# Patient Record
Sex: Female | Born: 1945 | Race: White | Hispanic: No | Smoking: Former smoker
Health system: Southern US, Community
[De-identification: ages and names within clinical notes are randomized; demographics above are authoritative.]

## PROBLEM LIST (undated history)

## (undated) DIAGNOSIS — J449 Chronic obstructive pulmonary disease, unspecified: Secondary | ICD-10-CM

## (undated) DIAGNOSIS — K219 Gastro-esophageal reflux disease without esophagitis: Secondary | ICD-10-CM

## (undated) DIAGNOSIS — I10 Essential (primary) hypertension: Secondary | ICD-10-CM

## (undated) DIAGNOSIS — F419 Anxiety disorder, unspecified: Secondary | ICD-10-CM

## (undated) DIAGNOSIS — E079 Disorder of thyroid, unspecified: Secondary | ICD-10-CM

## (undated) HISTORY — PX: WISDOM TOOTH EXTRACTION: SHX21

## (undated) HISTORY — DX: Anxiety disorder, unspecified: F41.9

## (undated) HISTORY — PX: OTHER SURGICAL HISTORY: SHX169

## (undated) HISTORY — PX: CATARACT EXTRACTION: SUR2

## (undated) HISTORY — PX: LAPAROSCOPY: SHX197

---

## 2004-12-20 ENCOUNTER — Emergency Department (HOSPITAL_COMMUNITY): Admission: EM | Admit: 2004-12-20 | Discharge: 2004-12-20 | Payer: Self-pay | Admitting: Emergency Medicine

## 2006-03-02 ENCOUNTER — Ambulatory Visit (HOSPITAL_COMMUNITY): Admission: RE | Admit: 2006-03-02 | Discharge: 2006-03-02 | Payer: Self-pay | Admitting: Internal Medicine

## 2010-10-20 ENCOUNTER — Emergency Department (HOSPITAL_COMMUNITY)
Admission: EM | Admit: 2010-10-20 | Discharge: 2010-10-20 | Payer: Self-pay | Source: Home / Self Care | Admitting: Emergency Medicine

## 2011-02-09 LAB — DIFFERENTIAL
Basophils Absolute: 0.1 10*3/uL (ref 0.0–0.1)
Basophils Relative: 1 % (ref 0–1)
Eosinophils Relative: 1 % (ref 0–5)
Monocytes Absolute: 0.6 10*3/uL (ref 0.1–1.0)

## 2011-02-09 LAB — COMPREHENSIVE METABOLIC PANEL
ALT: 12 U/L (ref 0–35)
AST: 16 U/L (ref 0–37)
Albumin: 4.2 g/dL (ref 3.5–5.2)
Alkaline Phosphatase: 58 U/L (ref 39–117)
Chloride: 98 mEq/L (ref 96–112)
GFR calc Af Amer: >60 mL/min (ref >60)
Potassium: 4.3 mEq/L (ref 3.5–5.1)
Sodium: 133 mEq/L — ABNORMAL LOW (ref 135–145)
Total Bilirubin: 0.5 mg/dL (ref 0.3–1.2)

## 2011-02-09 LAB — CBC
MCV: 87.9 fL (ref 78.0–100.0)
Platelets: 289 10*3/uL (ref 150–400)
RBC: 4.63 MIL/uL (ref 3.87–5.11)
WBC: 9.6 10*3/uL (ref 4.0–10.5)

## 2012-10-04 ENCOUNTER — Other Ambulatory Visit: Payer: Self-pay | Admitting: Internal Medicine

## 2012-10-04 DIAGNOSIS — Z1231 Encounter for screening mammogram for malignant neoplasm of breast: Secondary | ICD-10-CM

## 2012-11-17 ENCOUNTER — Ambulatory Visit: Payer: Self-pay

## 2013-06-20 ENCOUNTER — Other Ambulatory Visit (HOSPITAL_COMMUNITY): Payer: Self-pay | Admitting: Internal Medicine

## 2013-06-20 DIAGNOSIS — Z139 Encounter for screening, unspecified: Secondary | ICD-10-CM

## 2013-06-26 ENCOUNTER — Ambulatory Visit (HOSPITAL_COMMUNITY): Payer: Self-pay

## 2013-12-28 ENCOUNTER — Other Ambulatory Visit: Payer: Self-pay | Admitting: Internal Medicine

## 2013-12-28 ENCOUNTER — Ambulatory Visit
Admission: RE | Admit: 2013-12-28 | Discharge: 2013-12-28 | Disposition: A | Discharge status: Home / Self Care | Payer: Medicare Other | Source: Ambulatory Visit | Attending: Internal Medicine | Admitting: Internal Medicine

## 2013-12-28 DIAGNOSIS — K219 Gastro-esophageal reflux disease without esophagitis: Secondary | ICD-10-CM

## 2018-06-21 ENCOUNTER — Encounter (HOSPITAL_COMMUNITY): Payer: Self-pay | Admitting: Emergency Medicine

## 2018-06-21 ENCOUNTER — Emergency Department (HOSPITAL_COMMUNITY)
Admission: EM | Admit: 2018-06-21 | Discharge: 2018-06-21 | Disposition: A | Discharge status: Home / Self Care | Payer: Medicare Other | Attending: Emergency Medicine | Admitting: Emergency Medicine

## 2018-06-21 ENCOUNTER — Other Ambulatory Visit: Payer: Self-pay

## 2018-06-21 DIAGNOSIS — F17210 Nicotine dependence, cigarettes, uncomplicated: Secondary | ICD-10-CM | POA: Insufficient documentation

## 2018-06-21 DIAGNOSIS — E871 Hypo-osmolality and hyponatremia: Secondary | ICD-10-CM | POA: Diagnosis not present

## 2018-06-21 DIAGNOSIS — Z720 Tobacco use: Secondary | ICD-10-CM

## 2018-06-21 DIAGNOSIS — R030 Elevated blood-pressure reading, without diagnosis of hypertension: Secondary | ICD-10-CM | POA: Diagnosis present

## 2018-06-21 DIAGNOSIS — Z79899 Other long term (current) drug therapy: Secondary | ICD-10-CM | POA: Diagnosis not present

## 2018-06-21 DIAGNOSIS — I10 Essential (primary) hypertension: Secondary | ICD-10-CM | POA: Diagnosis not present

## 2018-06-21 HISTORY — DX: Disorder of thyroid, unspecified: E07.9

## 2018-06-21 HISTORY — DX: Gastro-esophageal reflux disease without esophagitis: K21.9

## 2018-06-21 HISTORY — DX: Essential (primary) hypertension: I10

## 2018-06-21 LAB — CBC
HCT: 42 % (ref 36.0–46.0)
Hemoglobin: 14.3 g/dL (ref 12.0–15.0)
MCH: 29.5 pg (ref 26.0–34.0)
MCHC: 34 g/dL (ref 30.0–36.0)
MCV: 86.8 fL (ref 78.0–100.0)
PLATELETS: 322 10*3/uL (ref 150–400)
RBC: 4.84 MIL/uL (ref 3.87–5.11)
RDW: 14.8 % (ref 11.5–15.5)
WBC: 11 10*3/uL — AB (ref 4.0–10.5)

## 2018-06-21 LAB — BASIC METABOLIC PANEL
Anion gap: 10 (ref 5–15)
BUN: 8 mg/dL (ref 8–23)
CALCIUM: 9.4 mg/dL (ref 8.9–10.3)
CO2: 27 mmol/L (ref 22–32)
CREATININE: 0.6 mg/dL (ref 0.44–1.00)
Chloride: 95 mmol/L — ABNORMAL LOW (ref 98–111)
Glucose, Bld: 103 mg/dL — ABNORMAL HIGH (ref 70–99)
Potassium: 3.9 mmol/L (ref 3.5–5.1)
SODIUM: 132 mmol/L — AB (ref 135–145)

## 2018-06-21 MED ORDER — HYDROCHLOROTHIAZIDE 12.5 MG PO CAPS: 12.5000 mg | ORAL_CAPSULE | Freq: Every day | ORAL | Status: DC

## 2018-06-21 MED ORDER — HYDROCHLOROTHIAZIDE 25 MG PO TABS
ORAL_TABLET | ORAL | Status: AC
  Administered 2018-06-21: 12.5 mg
  Filled 2018-06-21: qty 1

## 2018-06-21 MED ORDER — HYDROCHLOROTHIAZIDE 12.5 MG PO CAPS: 12.5000 mg | ORAL_CAPSULE | Freq: Every day | ORAL | 0 refills | Status: DC

## 2018-06-21 NOTE — ED Provider Notes (Signed)
[facility] EMERGENCY DEPARTMENT Provider Note   CSN: [id number] Arrival date & time: 06/21/18  1519     History   Chief Complaint Chief Complaint  Patient presents with  . Hypertension    HPI Sydney Jones is a 72 y.o. female.  HPI   She is here today for evaluation of high blood pressure which was found today at preop for cataract removal from left eye.  She had a right eye cataract removal last week and at that time her blood pressure was mildly elevated.  Today it was 210/110.  She denies headache, chest pain, shortness of breath, weakness or dizziness.  She is taking her usual medication, lisinopril, as prescribed.  She smokes cigarettes.  There are no other no modifying factors.  Past Medical History:  Diagnosis Date  . Acid reflux   . Hypertension   . Thyroid disease     There are no active problems to display for this patient.   Past Surgical History:  Procedure Laterality Date  . CATARACT EXTRACTION    . perdontal    . WISDOM TOOTH EXTRACTION       OB History   None      Home Medications    Prior to Admission medications   Medication Sig Start Date End Date Taking? Authorizing Provider  DUREZOL 0.05 % EMUL INSTILL 1 DROP TWICE DAILY IN THE OPERATIVE EYE ( START AFTER SURGERY FOR 3 WEEKS) 05/25/18  Yes [provider]  esomeprazole (NEXIUM) 40 MG capsule Take 40 mg by mouth every morning. 06/04/18  Yes [provider]  levothyroxine (SYNTHROID, LEVOTHROID) 25 MCG tablet Take 25 mcg by mouth every morning. 05/14/18  Yes [provider]  lisinopril (PRINIVIL,ZESTRIL) 40 MG tablet Take 40 mg by mouth every morning. 06/04/18  Yes [provider]  loratadine (CLARITIN) 10 MG tablet Take 10 mg by mouth every morning.   Yes [provider]  ofloxacin (OCUFLOX) 0.3 % ophthalmic solution INSTILL 1 DROP TWICE DAILY IN THE OPERATIVE (LEFT) EYE ( START 2 DAYS PRIOR TO SURGERY AND CONTINUE 3 WEEKS AFTER SURGERY) 05/25/18  Yes  [provider]  hydrochlorothiazide (MICROZIDE) 12.5 MG capsule Take 1 capsule (12.5 mg total) by mouth daily. 06/21/18   Mancel Bale, MD    Family History History reviewed. No pertinent family history.  Social History Social History   Tobacco Use  . Smoking status: Current Every Day Smoker    Packs/day: 1.00    Types: Cigarettes  . Smokeless tobacco: Never Used  Substance Use Topics  . Alcohol use: Not Currently  . Drug use: Never     Allergies   Codeine and Tetracyclines & related   Review of Systems Review of Systems  All other systems reviewed and are negative.    Physical Exam Updated Vital Signs BP (!) 229/99   Pulse 77   Temp 98.3 F (36.8 C) (Oral)   Resp 18   Ht 5' 3.5" (1.613 m)   Wt 54.4 kg (120 lb)   SpO2 95%   BMI 20.92 kg/m   Physical Exam  Constitutional: She is oriented to person, place, and time. She appears well-developed and well-nourished. No distress.  HENT:  Head: Normocephalic and atraumatic.  Eyes: Pupils are equal, round, and reactive to light. Conjunctivae and EOM are normal.  Neck: Normal range of motion and phonation normal. Neck supple.  Cardiovascular: Normal rate and regular rhythm.  Pulmonary/Chest: Effort normal and breath sounds normal. She exhibits no tenderness.  Abdominal: Soft. She exhibits no distension. There is no tenderness. There is no guarding.  Musculoskeletal: Normal range of motion. She exhibits no edema, tenderness or deformity.  Neurological: She is alert and oriented to person, place, and time. She exhibits normal muscle tone.  Skin: Skin is warm and dry.  Psychiatric: She has a normal mood and affect. Her behavior is normal. Judgment and thought content normal.  Nursing note and vitals reviewed.    ED Treatments / Results  Labs (all labs ordered are listed, but only abnormal results are displayed) Labs Reviewed  BASIC METABOLIC PANEL - Abnormal; Notable for the following components:       Result Value   Sodium 132 (*)    Chloride 95 (*)    Glucose, Bld 103 (*)    All other components within normal limits  CBC - Abnormal; Notable for the following components:   WBC 11.0 (*)    All other components within normal limits    EKG None  Radiology No results found.  Procedures Procedures (including critical care time)  Medications Ordered in ED Medications  hydrochlorothiazide (MICROZIDE) capsule 12.5 mg (12.5 mg Oral Not Given 06/21/18 1725)  hydrochlorothiazide (HYDRODIURIL) 25 MG tablet (12.5 mg  Given 06/21/18 1725)     Initial Impression / Assessment and Plan / ED Course  I have reviewed the triage vital signs and the nursing notes.  Pertinent labs & imaging results that were available during my care of the patient were reviewed by me and considered in my medical decision making (see chart for details).  Clinical Course as of Jun 21 1750  Wed Jun 21, 2018  1716 Normal except white count high 10  CBC(!) [EW]  1740 Normal except sodium low, chloride low, glucose high  Basic metabolic panel(!) [EW]  1740 Normal except white count high  CBC(!) [EW]  1740 Persistent elevation  BP(!): 229/99 [EW]    Clinical Course User Index [EW] Mancel BaleWentz, Daijanae Rafalski, MD     Patient Vitals for the past 24 hrs:  BP Temp Temp src Pulse Resp SpO2 Height Weight  06/21/18 1730 (!) 229/99 - - - - - - -  06/21/18 1700 (!) 214/98 - - 77 - 95 % - -  06/21/18 1651 - - - 79 - 98 % - -  06/21/18 1650 - - - 84 - 95 % - -  06/21/18 1649 (!) 232/102 - - 78 - 96 % - -  06/21/18 1538 (!) 229/100 98.3 F (36.8 C) Oral 80 18 96 % - -  06/21/18 1535 - - - - - - 5' 3.5" (1.613 m) 54.4 kg (120 lb)    5:49 PM Reevaluation with update and discussion. After initial assessment and treatment, an updated evaluation reveals she remains comfortable, and does not have any further complaints.  He states that she has been trying to avoid drinking a lot of water because of low sodium in the past.  Findings  discussed and questions answered. Mancel BaleElliott Alyzah Pelly   Medical Decision Making: Hypertension, asymptomatic, with incidental mild hyponatremia and hypochloremia.  Patient smokes cigarettes.  Patient may benefit from increasing electrolyte intake, and close monitoring while on a diuretic medication.  She has a PCP for follow-up care.  Hypertensive urgency, metabolic instability or impending vascular collapse.  CRITICAL CARE-no Performed by: Mancel BaleElliott Deylan Canterbury   Nursing Notes Reviewed/ Care Coordinated Applicable Imaging Reviewed Interpretation of Laboratory Data incorporated into ED treatment  The patient appears reasonably screened and/or stabilized for discharge and I  doubt any other medical condition or other [facility] requiring further screening, evaluation, or treatment in the ED at this time prior to discharge.  Plan: Home Medications-continue usual medications; Home Treatments-increase electrolyte intake using a product such as Gatorade once or twice a day.  Stop smoking.; return here if the recommended treatment, does not improve the symptoms; Recommended follow up-PCP follow-up 1 week for blood pressure check and repeat electrolyte testing.     Final Clinical Impressions(s) / ED Diagnoses   Final diagnoses:  Hypertension, unspecified type  Hyponatremia  Tobacco abuse    ED Discharge Orders        Ordered    hydrochlorothiazide (MICROZIDE) 12.5 MG capsule  Daily     06/21/18 1746       Mancel Bale, MD 06/21/18 1751

## 2018-06-21 NOTE — ED Triage Notes (Signed)
Pt states went to surgical center today to have cataract surgery and had to cancel due to her hypertension. Pt denies headache or other symptoms. Pt states took lisinopril this am and has been checking BP at home and has not came down. Unable to get appt with MD.

## 2018-06-21 NOTE — Discharge Instructions (Addendum)
Your blood pressure is mildly elevated today and requires an additional medication.  We are choosing hydrochlorothiazide, to help that.  Unfortunately this can make sodium drop and yours is already slightly low.  To help counteract this effect, drink 16 to 32 ounces of Gatorade each day, as an electrolyte supplement.  Follow-up with your PCP next week for checkup and clearance for surgery.  Try to stop smoking and get some exercise daily to improve your condition.

## 2018-08-22 ENCOUNTER — Other Ambulatory Visit: Payer: Self-pay | Admitting: Internal Medicine

## 2018-08-22 ENCOUNTER — Ambulatory Visit
Admission: RE | Admit: 2018-08-22 | Discharge: 2018-08-22 | Disposition: A | Discharge status: Home / Self Care | Payer: Medicare Other | Source: Ambulatory Visit | Attending: Internal Medicine | Admitting: Internal Medicine

## 2018-08-22 DIAGNOSIS — F17210 Nicotine dependence, cigarettes, uncomplicated: Secondary | ICD-10-CM

## 2018-09-28 ENCOUNTER — Encounter (HOSPITAL_COMMUNITY): Payer: Self-pay | Admitting: Emergency Medicine

## 2018-09-28 ENCOUNTER — Emergency Department (HOSPITAL_COMMUNITY)
Admission: EM | Admit: 2018-09-28 | Discharge: 2018-09-28 | Disposition: A | Discharge status: Home / Self Care | Payer: Medicare Other | Attending: Emergency Medicine | Admitting: Emergency Medicine

## 2018-09-28 ENCOUNTER — Other Ambulatory Visit: Payer: Self-pay

## 2018-09-28 ENCOUNTER — Emergency Department (HOSPITAL_COMMUNITY): Payer: Medicare Other

## 2018-09-28 DIAGNOSIS — Z79899 Other long term (current) drug therapy: Secondary | ICD-10-CM | POA: Insufficient documentation

## 2018-09-28 DIAGNOSIS — F17210 Nicotine dependence, cigarettes, uncomplicated: Secondary | ICD-10-CM | POA: Insufficient documentation

## 2018-09-28 DIAGNOSIS — I10 Essential (primary) hypertension: Secondary | ICD-10-CM | POA: Insufficient documentation

## 2018-09-28 DIAGNOSIS — M25562 Pain in left knee: Secondary | ICD-10-CM | POA: Diagnosis present

## 2018-09-28 NOTE — Discharge Instructions (Addendum)
Elevate and apply ice packs on and off to your knee.  Wear the knee sleeve as needed for support but do not wear continuously or at bedtime.  Tylenol every 4 hours if needed for pain.  Call the orthopedic provider listed to arrange follow-up appointment in 1 week if not improving.

## 2018-09-28 NOTE — ED Provider Notes (Signed)
[facility] EMERGENCY DEPARTMENT Provider Note   CSN: [id number] Arrival date & time: 09/28/18  1505     History   Chief Complaint Chief Complaint  Patient presents with  . Knee Pain    HPI Sydney Jones is a 72 y.o. female.  HPI   Sydney Jones is a 72 y.o. female who presents to the Emergency Department complaining of left lateral knee pain that began earlier today.  She states that she was "kicking off my shoe" and felt a pop in the side of her left knee.  No fall.  She states the pain was severe at the time and when she attempted to bear weight, she felt another popping sensation to her lateral knee.  Since that time pain has improved.  She states that she is able to ambulate with mild discomfort only.  She has not tried any therapies prior to arrival.  She denies fall, swelling, numbness, or other injuries.  No previous knee pain.  Past Medical History:  Diagnosis Date  . Acid reflux   . Hypertension   . Thyroid disease     There are no active problems to display for this patient.   Past Surgical History:  Procedure Laterality Date  . CATARACT EXTRACTION    . perdontal    . WISDOM TOOTH EXTRACTION       OB History   None      Home Medications    Prior to Admission medications   Medication Sig Start Date End Date Taking? Authorizing Provider  DUREZOL 0.05 % EMUL INSTILL 1 DROP TWICE DAILY IN THE OPERATIVE EYE ( START AFTER SURGERY FOR 3 WEEKS) 05/25/18   [provider]  esomeprazole (NEXIUM) 40 MG capsule Take 40 mg by mouth every morning. 06/04/18   [provider]  hydrochlorothiazide (MICROZIDE) 12.5 MG capsule Take 1 capsule (12.5 mg total) by mouth daily. 06/21/18   Mancel Bale, MD  levothyroxine (SYNTHROID, LEVOTHROID) 25 MCG tablet Take 25 mcg by mouth every morning. 05/14/18   [provider]  lisinopril (PRINIVIL,ZESTRIL) 40 MG tablet Take 40 mg by mouth every morning. 06/04/18   [provider]  loratadine  (CLARITIN) 10 MG tablet Take 10 mg by mouth every morning.    [provider]  ofloxacin (OCUFLOX) 0.3 % ophthalmic solution INSTILL 1 DROP TWICE DAILY IN THE OPERATIVE (LEFT) EYE ( START 2 DAYS PRIOR TO SURGERY AND CONTINUE 3 WEEKS AFTER SURGERY) 05/25/18   [provider]    Family History History reviewed. No pertinent family history.  Social History Social History   Tobacco Use  . Smoking status: Current Every Day Smoker    Packs/day: 1.00    Types: Cigarettes  . Smokeless tobacco: Never Used  Substance Use Topics  . Alcohol use: Not Currently  . Drug use: Never     Allergies   Codeine and Tetracyclines & related   Review of Systems Review of Systems  Constitutional: Negative for chills and fever.  Gastrointestinal: Negative for nausea and vomiting.  Musculoskeletal: Positive for arthralgias (Left knee pain). Negative for back pain, joint swelling and neck pain.  Skin: Negative for color change and wound.  Neurological: Negative for weakness and numbness.     Physical Exam Updated Vital Signs BP (!) 156/76 (BP Location: Right Arm)   Pulse 79   Temp 97.9 F (36.6 C) (Oral)   Resp 16   Ht 5\' 4"  (1.626 m)   Wt 54.4 kg   SpO2 96%  BMI 20.60 kg/m   Physical Exam  Constitutional: She appears well-nourished. No distress.  HENT:  Head: Atraumatic.  Mouth/Throat: Oropharynx is clear and moist.  Cardiovascular: Normal rate, regular rhythm and intact distal pulses.  Pulmonary/Chest: Effort normal and breath sounds normal.  Musculoskeletal: Normal range of motion. She exhibits tenderness. She exhibits no edema or deformity.  Mild ttp of the anterior left knee with movement of the patella.  No bony deformity.  Neg Drawer sign.  No tenderness on valgus and varus stress.  No erythema or effusion  Neurological: She is alert. No sensory deficit.  Skin: Skin is warm. Capillary refill takes less than 2 seconds.  Nursing note and vitals reviewed.    ED  Treatments / Results  Labs (all labs ordered are listed, but only abnormal results are displayed) Labs Reviewed - No data to display  EKG None  Radiology Dg Knee Complete 4 Views Left  Result Date: 09/28/2018 CLINICAL DATA:  Popping sensation EXAM: LEFT KNEE - COMPLETE 4+ VIEW COMPARISON:  None. FINDINGS: No evidence of fracture, dislocation, or joint effusion. No evidence of arthropathy or other focal bone abnormality. Soft tissues are unremarkable. IMPRESSION: Negative. Electronically Signed   By: Gerome Sam III M.D   On: 09/28/2018 15:41    Procedures Procedures (including critical care time)  Medications Ordered in ED Medications - No data to display   Initial Impression / Assessment and Plan / ED Course  I have reviewed the triage vital signs and the nursing notes.  Pertinent labs & imaging results that were available during my care of the patient were reviewed by me and considered in my medical decision making (see chart for details).     Patient well-appearing.  X-ray reassuring.  No fall.  Possible self resolved patellar dislocation.  Remains neurovascularly intact.  She has full range of motion of the left knee at this time.  She is able to bear weight without difficulty. Knee Sleeve applied.  Patient agrees to RICE therapy and close orthopedic follow-up in 1 week if not improving.  Final Clinical Impressions(s) / ED Diagnoses   Final diagnoses:  Acute pain of left knee    ED Discharge Orders    None       Pauline Aus, PA-C 09/28/18 Merrily Brittle    Eber Hong, MD 09/29/18 1226

## 2018-09-28 NOTE — ED Triage Notes (Signed)
Pt states she kicked her shoe off and felt a pop in her knee on the L side. Was able to walk on it but still felt "popping" sensation. Denies previous injury.

## 2019-10-23 ENCOUNTER — Other Ambulatory Visit: Payer: Self-pay

## 2019-10-23 DIAGNOSIS — Z20822 Contact with and (suspected) exposure to covid-19: Secondary | ICD-10-CM

## 2019-10-24 LAB — NOVEL CORONAVIRUS, NAA: SARS-CoV-2, NAA: NOT DETECTED

## 2020-02-25 ENCOUNTER — Ambulatory Visit: Payer: Medicare Other | Attending: Internal Medicine

## 2020-02-25 ENCOUNTER — Other Ambulatory Visit: Payer: Self-pay

## 2020-02-25 DIAGNOSIS — Z20822 Contact with and (suspected) exposure to covid-19: Secondary | ICD-10-CM

## 2020-02-26 LAB — NOVEL CORONAVIRUS, NAA: SARS-CoV-2, NAA: NOT DETECTED

## 2020-02-26 LAB — SARS-COV-2, NAA 2 DAY TAT

## 2020-02-27 ENCOUNTER — Telehealth: Payer: Self-pay | Admitting: *Deleted

## 2020-02-27 NOTE — Telephone Encounter (Signed)
Reviewed negative Covid 19 results with patient.

## 2020-03-19 ENCOUNTER — Encounter: Payer: Self-pay | Admitting: Gastroenterology

## 2020-04-03 NOTE — Progress Notes (Signed)
Referring Provider: Wenda Low, MD Primary Care Physician:  Wenda Low, MD Primary Gastroenterologist:  Dr. Gala Romney  Chief Complaint  Patient presents with  . Abdominal Pain    mid upper abd, milk helps pain  . Constipation    "since I was born"  . +stool test    never had tcs; trying to avoid tcs    HPI:   Sydney Jones is a 74 y.o. female presenting today at the request of Wenda Low, MD for positive stool test.  Most recent hemoglobin 14.3 from July 2019. Hemoglobin 13.5 in October 2020. Stool test result was not sent with referral.  Today: States she is not here for + stool test. She is concerned about her abdominal pain.   After eating breakfast, she has aching in the upper abdomen. Toast with ham or bacon/egg for breakfast. Improves with drinking milk. Around 2PM, ache returns. Doesn't bother her at night. Started 4 weeks ago. No unintentional weight loss. No change in appetite. No early satiety. Last Tuesday, bitter taste started in back of her throat. Occurring most days. Takes nexium 40 mg daily for about 12 years for reflux.  No NSAIDs. No black stools. No blood in the stool. No alcohol. No prior EGD. No right sided pain. No N/V. No dysphagia.   Has struggled with constipation since she was born.  No worsens this.  Taking dulcolax as needed. Last BM Tuesday. Has taken dulcolax the last 3 days, still no BM. Always has to strain to get BMs started. This is her baseline. No abdominal pain.  Stool test in October. Saw GI doc in [place]. [facility]. Requested another stool test. Had a second stool test completed in Jan.  Repeat stool test in April.  She was told it was positive. This is when her stomach problem started. Doesn't want to be seen in [place].   Doesn't want to have a colonoscopy.  States she will consider this further.  She wants her upper abdominal pain addressed.  Reports having hemorrhoids since giving birth. Has stinging and burning at times.  Not bothered by this. Has used preparation H in the past when hemorrhoids were causing more trouble but nothing significant in 30 years.   Past Medical History:  Diagnosis Date  . Acid reflux   . Anxiety   . Hypertension   . Thyroid disease    Hypothyroid    Past Surgical History:  Procedure Laterality Date  . CATARACT EXTRACTION    . LAPAROSCOPY  1980s   for endometriosis   . perdontal    . WISDOM TOOTH EXTRACTION      Current Outpatient Medications  Medication Sig Dispense Refill  . amLODipine (NORVASC) 5 MG tablet Take 5 mg by mouth 2 (two) times daily.    Marland Kitchen doxazosin (CARDURA) 2 MG tablet Take 2 mg by mouth at bedtime.    Marland Kitchen levothyroxine (SYNTHROID, LEVOTHROID) 25 MCG tablet Take 25 mcg by mouth every morning.  6  . lisinopril (PRINIVIL,ZESTRIL) 40 MG tablet Take 40 mg by mouth every morning.  2  . loratadine (CLARITIN) 10 MG tablet Take 10 mg by mouth every morning.    . pantoprazole (PROTONIX) 40 MG tablet Take 1 tablet (40 mg total) by mouth daily before breakfast. 30 tablet 3   No current facility-administered medications for this visit.    Allergies as of 04/04/2020 - Review Complete 04/04/2020  Allergen Reaction Noted  . Codeine Nausea Only 06/21/2018  . Tetracyclines & related Other (See  Comments) 06/21/2018    Family History  Problem Relation Age of Onset  . Colon cancer Neg Hx   . Stomach cancer Neg Hx   . Esophageal cancer Neg Hx   . Pancreatic cancer Neg Hx     Social History   Socioeconomic History  . Marital status: Married    Spouse name: Not on file  . Number of children: Not on file  . Years of education: Not on file  . Highest education level: Not on file  Occupational History  . Not on file  Tobacco Use  . Smoking status: Current Every Day Smoker    Packs/day: 1.00    Types: Cigarettes  . Smokeless tobacco: Never Used  Substance and Sexual Activity  . Alcohol use: Not Currently  . Drug use: Never  . Sexual activity: Not on file    Other Topics Concern  . Not on file  Social History Narrative  . Not on file   Social Determinants of Health   Financial Resource Strain:   . Difficulty of Paying Living Expenses:   Food Insecurity:   . Worried About Programme researcher, broadcasting/film/video in the Last Year:   . Barista in the Last Year:   Transportation Needs:   . Freight forwarder (Medical):   Marland Kitchen Lack of Transportation (Non-Medical):   Physical Activity:   . Days of Exercise per Week:   . Minutes of Exercise per Session:   Stress:   . Feeling of Stress :   Social Connections:   . Frequency of Communication with Friends and Family:   . Frequency of Social Gatherings with Friends and Family:   . Attends Religious Services:   . Active Member of Clubs or Organizations:   . Attends Banker Meetings:   Marland Kitchen Marital Status:   Intimate Partner Violence:   . Fear of Current or Ex-Partner:   . Emotionally Abused:   Marland Kitchen Physically Abused:   . Sexually Abused:     Review of Systems: Gen: Denies any fever, chills, lightheadedness, dizziness, presyncope, syncope. CV: Denies chest pain or heart palpitations. Resp: Occasional shortness of breath. None at rest. Occasional cough in the morning.  GI: See HPI GU : Denies urinary burning, urinary frequency, urinary hesitancy MS: Denies joint pain Derm: Denies rash Psych: Denies depression or anxiety Heme: See HPI  Physical Exam: BP 136/69   Pulse 90   Temp (!) 96.8 F (36 C) (Temporal)   Ht 5\' 4"  (1.626 m)   Wt 127 lb 6.4 oz (57.8 kg)   BMI 21.87 kg/m  General:   Alert and oriented. Pleasant and cooperative. Well-nourished and well-developed.  Head:  Normocephalic and atraumatic. Eyes:  Without icterus, sclera clear and conjunctiva pink.  Ears:  Normal auditory acuity. Lungs:  Clear to auscultation bilaterally. No wheezes, rales, or rhonchi. No distress.  Heart:  S1, S2 present without murmurs appreciated.  Abdomen:  +BS, soft, non-tender and non-distended.  No HSM noted. No guarding or rebound. No masses appreciated.  Rectal:  Deferred  Msk:  Symmetrical without gross deformities. Normal posture. Extremities:  Without edema. Neurologic:  Alert and  oriented x4;  grossly normal neurologically. Skin:  Intact without significant lesions or rashes. Psych: Normal mood and affect.

## 2020-04-04 ENCOUNTER — Ambulatory Visit: Payer: Medicare Other | Admitting: Gastroenterology

## 2020-04-04 ENCOUNTER — Other Ambulatory Visit: Payer: Self-pay

## 2020-04-04 ENCOUNTER — Encounter: Payer: Self-pay | Admitting: Gastroenterology

## 2020-04-04 DIAGNOSIS — R1013 Epigastric pain: Secondary | ICD-10-CM | POA: Diagnosis not present

## 2020-04-04 DIAGNOSIS — R195 Other fecal abnormalities: Secondary | ICD-10-CM | POA: Diagnosis not present

## 2020-04-04 DIAGNOSIS — K5900 Constipation, unspecified: Secondary | ICD-10-CM | POA: Diagnosis not present

## 2020-04-04 MED ORDER — PANTOPRAZOLE SODIUM 40 MG PO TBEC: 40.0000 mg | DELAYED_RELEASE_TABLET | Freq: Every day | ORAL | 3 refills | Status: DC

## 2020-04-04 NOTE — Patient Instructions (Addendum)
We will get you scheduled for an upper endoscopy in the near future with Dr. Gala Romney.  Please have labs completed at [facility].  We will call you with results.  Stop Nexium and start Protonix 40 mg daily 30 minutes before breakfast.  Be sure you separate Protonix at least 3 hours from your levothyroxine.  Be sure to follow GERD diet.  Avoid fried, fatty, greasy, spicy, citrus foods.  Avoid caffeine and carbonated beverages.  Do not eat within 3 hours of laying down.  Avoid all NSAIDs.  This includes ibuprofen, Aleve, Advil, and Goody powders.  For constipation: I am providing samples of Linzess 145 mcg.  Take this daily 30 minutes before your first meal.  Call in 1-2 weeks with a progress report. Sure you are drinking enough water to keep urine pale yellow to clear. Be sure you are eating plenty of fruits and vegetables to maintain adequate fiber intake.  We will plan to follow-up with you after your upper endoscopy.  Call with questions or concerns prior.  Aliene Altes, PA-C [facility]    Food Choices for Gastroesophageal Reflux Disease, Adult When you have gastroesophageal reflux disease (GERD), the foods you eat and your eating habits are very important. Choosing the right foods can help ease the discomfort of GERD. Consider working with a diet and nutrition specialist (dietitian) to help you make healthy food choices. What general guidelines should I follow?  Eating plan  Choose healthy foods low in fat, such as fruits, vegetables, whole grains, low-fat dairy products, and lean meat, fish, and poultry.  Eat frequent, small meals instead of three large meals each day. Eat your meals slowly, in a relaxed setting. Avoid bending over or lying down until 2-3 hours after eating.  Limit high-fat foods such as fatty meats or fried foods.  Limit your intake of oils, butter, and shortening to less than 8 teaspoons each day.  Avoid the following: ? Foods that  cause symptoms. These may be different for different people. Keep a food diary to keep track of foods that cause symptoms. ? Alcohol. ? Drinking large amounts of liquid with meals. ? Eating meals during the 2-3 hours before bed.  Cook foods using methods other than frying. This may include baking, grilling, or broiling. Lifestyle  Maintain a healthy weight. Ask your health care provider what weight is healthy for you. If you need to lose weight, work with your health care provider to do so safely.  Exercise for at least 30 minutes on 5 or more days each week, or as told by your health care provider.  Avoid wearing clothes that fit tightly around your waist and chest.  Do not use any products that contain nicotine or tobacco, such as cigarettes and e-cigarettes. If you need help quitting, ask your health care provider.  Sleep with the head of your bed raised. Use a wedge under the mattress or blocks under the bed frame to raise the head of the bed. What foods are not recommended? The items listed may not be a complete list. Talk with your dietitian about what dietary choices are best for you. Grains Pastries or quick breads with added fat. [place] toast. Vegetables Deep fried vegetables. [place] fries. Any vegetables prepared with added fat. Any vegetables that cause symptoms. For some people this may include tomatoes and tomato products, chili peppers, onions and garlic, and horseradish. Fruits Any fruits prepared with added fat. Any fruits that cause symptoms. For some people this may  include citrus fruits, such as oranges, grapefruit, pineapple, and lemons. Meats and other protein foods High-fat meats, such as fatty beef or pork, hot dogs, ribs, ham, sausage, salami and bacon. Fried meat or protein, including fried fish and fried chicken. Nuts and nut butters. Dairy Whole milk and chocolate milk. Sour cream. Cream. Ice cream. Cream cheese. Milk shakes. Beverages Coffee and tea, with or  without caffeine. Carbonated beverages. Sodas. Energy drinks. Fruit juice made with acidic fruits (such as orange or grapefruit). Tomato juice. Alcoholic drinks. Fats and oils Butter. Margarine. Shortening. Ghee. Sweets and desserts Chocolate and cocoa. Donuts. Seasoning and other foods Pepper. Peppermint and spearmint. Any condiments, herbs, or seasonings that cause symptoms. For some people, this may include curry, hot sauce, or vinegar-based salad dressings. Summary  When you have gastroesophageal reflux disease (GERD), food and lifestyle choices are very important to help ease the discomfort of GERD.  Eat frequent, small meals instead of three large meals each day. Eat your meals slowly, in a relaxed setting. Avoid bending over or lying down until 2-3 hours after eating.  Limit high-fat foods such as fatty meat or fried foods. This information is not intended to replace advice given to you by your health care provider. Make sure you discuss any questions you have with your health care provider. Document Revised: 03/08/2019 Document Reviewed: 11/16/2016 Elsevier Patient Education  2020 [employer].

## 2020-04-04 NOTE — Assessment & Plan Note (Addendum)
74 year old female with new onset intermittent epigastric pain x4 weeks.  Worsens with food and improves with milk.  Chronically on Nexium 40 mg daily for the last 12 years for reflux which has as reported Symptoms well controlled.  However, about 1 week ago, she started having a bitter taste in the back of her throat on most days.  No unintentional weight loss, changes in appetite, or early satiety.  No bright red blood per rectum, melena, nausea, vomiting, or dysphagia.  No NSAIDs.  No alcohol.  Per patient, positive stool test in October 2020, negative in January 2021, then again positive in April 2021.  I do not have these results.  Most recent hemoglobin 13.5 in October 2020.  Abdominal exam is benign.  The patient has not been taking any NSAIDs and has been on chronic acid suppression, but not expect her to have gastritis or PUD; never, cannot rule this out.  Concerns for H. pylori or malignancy as well as this is new onset over age 18.  She will need EGD for further evaluation.  I discussed adding colonoscopy due to positive stool test and no prior colonoscopy but patient declined.  Plan:  1. Proceed with EGD in the near future with Dr. Jena Gauss. The risks, benefits, and alternatives have been discussed in detail with patient. They have stated understanding and desire to proceed.  2. Stop Nexium and start Protonix 40 mg daily 30 minutes before breakfast.  Advised that she separates this at least 3 hours from levothyroxine. Would consider increasing this to twice daily if she has IDA or if GERD symptoms do not improve. 3. Update CBC and iron panel  4. Counseled on GERD diet.  Handout provided. 5. Advised to avoid all NSAIDs. 6. Follow-up after EGD.  Call with questions or concerns prior.

## 2020-04-04 NOTE — Assessment & Plan Note (Addendum)
Patient reports positive stool test in October 2020, negative stool test in January 2021, then again positive in April 2021.  No prior colonoscopy or upper endoscopy.  Chronic history of constipation not adequately controlled as discussed below.  Also reports chronic history of hemorrhoids with burning and stinging at times. She has had new onset intermittent epigastric pain worse with meals and improves with milk as well as acid in the back of her throat.  Had chronically been on Nexium 40 mg daily which kept reflux well controlled.  Denies NSAIDs.  No alcohol.  No bright red blood per rectum, melena, unintentional weight loss.  No family history of colon cancer or other GI cancers.  Most recent hemoglobin on file 13.5 in October 2020.  Considering new onset upper GI issues, concern for positive fecal occult blood coming from an upper GI source.  As she has been on chronic acid suppression and denies any NSAIDs, I would not expect her to have gastritis or PUD.  She could have H. pylori or possible malignancy. With no prior colonoscopy, cannot rule out lower GI etiology including colon polyps or malignancy.  Could also be from benign source such as hemorrhoids.  Patient declined colonoscopy today. States she wants her upper GI tract evaluated first and would consider colonoscopy in the future. Wants to avoid colonoscopy if possible.   Plan:  1. Update CBC and iron panel. 2. Proceed with EGD with Dr. Jena Gauss in the near future. The risks, benefits, and alternatives have been discussed in detail with patient. They have stated understanding and desire to proceed.  3. Stop Nexium and start Protonix 40 mg daily.  Would consider increasing this to twice daily if she has IDA or if GERD symptoms do not improve. Advised to separate this at least 3 hours from levothyroxine. 4.  Avoid all NSAIDs. 5.  Follow-up after EGD.  Call with questions or concerns prior.

## 2020-04-04 NOTE — Assessment & Plan Note (Addendum)
Chronic constipation.  Not adequately managed with Dulcolax laxatives as needed.  No lower abdominal pain, nausea, or vomiting.  Reports recently having a positive stool test in October that was then negative in January but again positive in April.  No prior colonoscopy.  Denies bright red blood per rectum or melena.  No unintentional weight loss.  No family history of colon cancer.  Patient declined colonoscopy today.  Stop Dulcolax and start Linzess 145 mcg daily 30 minutes before first meal.  Samples provided.  Requested progress report in 2 weeks. Drink enough water to keep urine failure to clear. Eat plenty of fruits and vegetables daily to maintain adequate fiber intake. Follow-up after EGD for upper GI issues as discussed below.

## 2020-04-07 ENCOUNTER — Other Ambulatory Visit: Payer: Self-pay

## 2020-04-07 ENCOUNTER — Telehealth: Payer: Self-pay

## 2020-04-07 NOTE — Telephone Encounter (Signed)
PA for EGD submitted via [facility] website. Case approved. PA# [id number], valid 05/02/20-07/31/20.

## 2020-04-09 LAB — CBC WITH DIFFERENTIAL/PLATELET
Absolute Monocytes: 607 cells/uL (ref 200–950)
Basophils Absolute: 123 cells/uL (ref 0–200)
Basophils Relative: 1.4 %
Eosinophils Absolute: 114 cells/uL (ref 15–500)
Eosinophils Relative: 1.3 %
HCT: 37.3 % (ref 35.0–45.0)
Hemoglobin: 12.1 g/dL (ref 11.7–15.5)
Lymphs Abs: 1109 cells/uL (ref 850–3900)
MCH: 27.8 pg (ref 27.0–33.0)
MCHC: 32.4 g/dL (ref 32.0–36.0)
MCV: 85.7 fL (ref 80.0–100.0)
MPV: 11.4 fL (ref 7.5–12.5)
Monocytes Relative: 6.9 %
Neutro Abs: 6846 cells/uL (ref 1500–7800)
Neutrophils Relative %: 77.8 %
Platelets: 345 10*3/uL (ref 140–400)
RBC: 4.35 10*6/uL (ref 3.80–5.10)
RDW: 13.4 % (ref 11.0–15.0)
Total Lymphocyte: 12.6 %
WBC: 8.8 10*3/uL (ref 3.8–10.8)

## 2020-04-09 LAB — IRON,TIBC AND FERRITIN PANEL
%SAT: 14 % (calc) — ABNORMAL LOW (ref 16–45)
Ferritin: 12 ng/mL — ABNORMAL LOW (ref 16–288)
Iron: 46 ug/dL (ref 45–160)
TIBC: 330 mcg/dL (calc) (ref 250–450)

## 2020-04-09 NOTE — Progress Notes (Signed)
Iron panel consistent with iron deficiency.  Ferritin 12 (L) percent saturation 14 (L), iron 46 (very low end of normal).  With iron deficiency, we are likely going to need to circle back to a colonoscopy as I discussed with the patient at the time of her office visit.  She is currently scheduled for an EGD on 05/02/2020 to evaluate epigastric pain.  I stopped her Nexium and start her on Protonix at her last visit.  Please see how patient is doing with epigastric pain/GERD.  If she is continuing with epigastric pain, we can try increasing the Protonix to twice daily while we are waiting on EGD.  We will hold off on starting iron for now as she is scheduled for procedure in just a few weeks.

## 2020-04-09 NOTE — H&P (View-Only) (Signed)
Iron panel consistent with iron deficiency.  Ferritin 12 (L) percent saturation 14 (L), iron 46 (very low end of normal).  With iron deficiency, we are likely going to need to circle back to a colonoscopy as I discussed with the patient at the time of her office visit.  She is currently scheduled for an EGD on 05/02/2020 to evaluate epigastric pain.  I stopped her Nexium and start her on Protonix at her last visit.  Please see how patient is doing with epigastric pain/GERD.  If she is continuing with epigastric pain, we can try increasing the Protonix to twice daily while we are waiting on EGD.  We will hold off on starting iron for now as she is scheduled for procedure in just a few weeks.

## 2020-04-23 ENCOUNTER — Other Ambulatory Visit: Payer: Self-pay | Admitting: Gastroenterology

## 2020-04-23 DIAGNOSIS — R1013 Epigastric pain: Secondary | ICD-10-CM

## 2020-04-23 MED ORDER — PANTOPRAZOLE SODIUM 40 MG PO TBEC: 40.0000 mg | DELAYED_RELEASE_TABLET | Freq: Two times a day (BID) | ORAL | 3 refills | Status: DC

## 2020-04-30 ENCOUNTER — Other Ambulatory Visit (HOSPITAL_COMMUNITY)
Admission: RE | Admit: 2020-04-30 | Discharge: 2020-04-30 | Disposition: A | Discharge status: Home / Self Care | Payer: Medicare Other | Source: Ambulatory Visit | Attending: Internal Medicine | Admitting: Internal Medicine

## 2020-04-30 ENCOUNTER — Other Ambulatory Visit: Payer: Self-pay

## 2020-04-30 DIAGNOSIS — Z01812 Encounter for preprocedural laboratory examination: Secondary | ICD-10-CM | POA: Insufficient documentation

## 2020-04-30 DIAGNOSIS — Z20822 Contact with and (suspected) exposure to covid-19: Secondary | ICD-10-CM | POA: Diagnosis not present

## 2020-04-30 LAB — SARS CORONAVIRUS 2 (TAT 6-24 HRS): SARS Coronavirus 2: NEGATIVE

## 2020-05-01 ENCOUNTER — Telehealth: Payer: Self-pay | Admitting: Internal Medicine

## 2020-05-01 NOTE — Telephone Encounter (Signed)
Called pt. Made aware she will start he clear liquids at 6pm-midnight. She will not be able to have any liquids after midnight. Made her aware she can only have solid foods until 6pm.

## 2020-05-01 NOTE — Telephone Encounter (Signed)
[phone] patient called stating that they moved her procedure time and someone needs to let her know what time to drink her prep

## 2020-05-02 ENCOUNTER — Encounter (HOSPITAL_COMMUNITY): Payer: Self-pay | Admitting: Internal Medicine

## 2020-05-02 ENCOUNTER — Ambulatory Visit (HOSPITAL_COMMUNITY)
Admission: RE | Admit: 2020-05-02 | Discharge: 2020-05-02 | Disposition: A | Discharge status: Home / Self Care | Payer: Medicare Other | Attending: Internal Medicine | Admitting: Internal Medicine

## 2020-05-02 ENCOUNTER — Encounter (HOSPITAL_COMMUNITY)
Admission: RE | Disposition: A | Discharge status: Home / Self Care | Payer: Self-pay | Source: Home / Self Care | Attending: Internal Medicine

## 2020-05-02 DIAGNOSIS — Z79899 Other long term (current) drug therapy: Secondary | ICD-10-CM | POA: Diagnosis not present

## 2020-05-02 DIAGNOSIS — Z881 Allergy status to other antibiotic agents status: Secondary | ICD-10-CM | POA: Diagnosis not present

## 2020-05-02 DIAGNOSIS — K317 Polyp of stomach and duodenum: Secondary | ICD-10-CM | POA: Diagnosis not present

## 2020-05-02 DIAGNOSIS — Z885 Allergy status to narcotic agent status: Secondary | ICD-10-CM | POA: Diagnosis not present

## 2020-05-02 DIAGNOSIS — K2100 Gastro-esophageal reflux disease with esophagitis, without bleeding: Secondary | ICD-10-CM

## 2020-05-02 DIAGNOSIS — F17210 Nicotine dependence, cigarettes, uncomplicated: Secondary | ICD-10-CM | POA: Diagnosis not present

## 2020-05-02 DIAGNOSIS — E039 Hypothyroidism, unspecified: Secondary | ICD-10-CM | POA: Insufficient documentation

## 2020-05-02 DIAGNOSIS — K2950 Unspecified chronic gastritis without bleeding: Secondary | ICD-10-CM | POA: Insufficient documentation

## 2020-05-02 DIAGNOSIS — R1013 Epigastric pain: Secondary | ICD-10-CM | POA: Insufficient documentation

## 2020-05-02 DIAGNOSIS — I10 Essential (primary) hypertension: Secondary | ICD-10-CM | POA: Insufficient documentation

## 2020-05-02 DIAGNOSIS — Z79890 Hormone replacement therapy (postmenopausal): Secondary | ICD-10-CM | POA: Insufficient documentation

## 2020-05-02 HISTORY — PX: POLYPECTOMY: SHX5525

## 2020-05-02 HISTORY — PX: BIOPSY: SHX5522

## 2020-05-02 HISTORY — PX: ESOPHAGOGASTRODUODENOSCOPY: SHX5428

## 2020-05-02 SURGERY — EGD (ESOPHAGOGASTRODUODENOSCOPY)
Anesthesia: Moderate Sedation

## 2020-05-02 MED ORDER — MIDAZOLAM HCL 5 MG/5ML IJ SOLN
INTRAMUSCULAR | Status: DC | PRN
  Administered 2020-05-02 (×2): 1 mg via INTRAVENOUS
  Administered 2020-05-02: 2 mg via INTRAVENOUS

## 2020-05-02 MED ORDER — STERILE WATER FOR IRRIGATION IR SOLN
Status: DC | PRN
  Administered 2020-05-02: 2.5 mL

## 2020-05-02 MED ORDER — MIDAZOLAM HCL 5 MG/5ML IJ SOLN
INTRAMUSCULAR | Status: AC
  Filled 2020-05-02: qty 10

## 2020-05-02 MED ORDER — MEPERIDINE HCL 50 MG/ML IJ SOLN
INTRAMUSCULAR | Status: AC
  Filled 2020-05-02: qty 1

## 2020-05-02 MED ORDER — ONDANSETRON HCL 4 MG/2ML IJ SOLN
INTRAMUSCULAR | Status: AC
  Filled 2020-05-02: qty 2

## 2020-05-02 MED ORDER — LIDOCAINE VISCOUS HCL 2 % MT SOLN
OROMUCOSAL | Status: DC | PRN
  Administered 2020-05-02: 1 via OROMUCOSAL

## 2020-05-02 MED ORDER — LIDOCAINE VISCOUS HCL 2 % MT SOLN
OROMUCOSAL | Status: AC
  Filled 2020-05-02: qty 15

## 2020-05-02 MED ORDER — MEPERIDINE HCL 100 MG/ML IJ SOLN
INTRAMUSCULAR | Status: DC | PRN
  Administered 2020-05-02: 15 mg via INTRAVENOUS
  Administered 2020-05-02: 25 mg via INTRAVENOUS

## 2020-05-02 MED ORDER — ONDANSETRON HCL 4 MG/2ML IJ SOLN
INTRAMUSCULAR | Status: DC | PRN
  Administered 2020-05-02: 4 mg via INTRAVENOUS

## 2020-05-02 MED ORDER — SODIUM CHLORIDE 0.9 % IV SOLN: INTRAVENOUS | Status: DC

## 2020-05-02 NOTE — Op Note (Signed)
[facility] Patient Name: Sydney Jones Procedure Date: 05/02/2020 7:35 AM MRN: [id number] Date of Birth: 03-13-1946 Attending MD: Gennette Pac , MD CSN: [id number] Age: 74 Admit Type: Outpatient Procedure:                Upper GI endoscopy Indications:              Dyspepsia Providers:                Gennette Pac, MD, Nena Polio, RN, Edythe Clarity, Technician Referring MD:              Medicines:                Midazolam 4 mg IV, Meperidine 40 mg IV Complications:            No immediate complications. Estimated Blood Loss:     Estimated blood loss was minimal. Procedure:                Pre-Anesthesia Assessment:                           - Prior to the procedure, a History and Physical                            was performed, and patient medications and                            allergies were reviewed. The patient's tolerance of                            previous anesthesia was also reviewed. The risks                            and benefits of the procedure and the sedation                            options and risks were discussed with the patient.                            All questions were answered, and informed consent                            was obtained. Prior Anticoagulants: The patient has                            taken no previous anticoagulant or antiplatelet                            agents. ASA Grade Assessment: II - A patient with                            mild systemic disease. After reviewing the risks  and benefits, the patient was deemed in                            satisfactory condition to undergo the procedure.                           After obtaining informed consent, the endoscope was                            passed under direct vision. Throughout the                            procedure, the patient's blood pressure, pulse, and                            oxygen  saturations were monitored continuously. The                            [id number]-[id number] ([id number]) scope was introduced through the                            mouth, and advanced to the second part of duodenum.                            The upper GI endoscopy was accomplished without                            difficulty. The patient tolerated the procedure                            well. Scope In: 7:56:28 AM Scope Out: 8:02:48 AM Total Procedure Duration: 0 hours 6 minutes 20 seconds  Findings:      Couple of distal esophageal erosions within 5 mm of the GE junction. No       Barrett's epithelium seen. Multiple fundal gland type polyps found in       the cardia and body 5 to 8 mm in size. No ulcer or infiltrating process.      Stomach was normal otherwise. Patent pylorus.      The duodenal bulb and second portion of the duodenum were normal. The       largest polyp seen was removed with cold biopsy forceps. Subsequently,       mucosal biopsies were taken for H. pylori testing Impression:               -Mild erosive reflux esophagitis. Benign-appearing                            gastric polyps?"status post polypectomy. Status post                            gastric mucosal biopsy                           -Normal duodenal bulb and second portion of the  duodenum.                           - Moderate Sedation:      Moderate (conscious) sedation was administered by the endoscopy nurse       and supervised by the endoscopist. The following parameters were       monitored: oxygen saturation, heart rate, blood pressure, respiratory       rate, EKG, adequacy of pulmonary ventilation, and response to care. Recommendation:           - Patient has a contact number available for                            emergencies. The signs and symptoms of potential                            delayed complications were discussed with the                            patient. Return to  normal activities tomorrow.                            Written discharge instructions were provided to the                            patient.                           - Advance diet as tolerated. For now, increase                            Protonix to 40 mg twice daily. Follow-up on                            pathology. Patient will need further evaluation of                            iron deficiency anemia in all likelihood. We will                            see her back in the office in 4 weeks. Procedure Code(s):        --- Professional ---                           [phone], Esophagogastroduodenoscopy, flexible,                            transoral; diagnostic, including collection of                            specimen(s) by brushing or washing, when performed                            (separate procedure) Diagnosis Code(s):        --- Professional ---  R10.13, Epigastric pain CPT copyright 2019 American Medical Association. All rights reserved. The codes documented in this report are preliminary and upon coder review may  be revised to meet current compliance requirements. Gerrit Friends. Ash Mcelwain, MD Gennette Pac, MD 05/02/2020 8:25:36 AM This report has been signed electronically. Number of Addenda: 0

## 2020-05-02 NOTE — Interval H&P Note (Signed)
History and Physical Interval Note:  05/02/2020 7:43 AM  Sydney Jones  has presented today for surgery, with the diagnosis of epigastric pain, + stool test.  The various methods of treatment have been discussed with the patient and family. After consideration of risks, benefits and other options for treatment, the patient has consented to  Procedure(s) with comments: ESOPHAGOGASTRODUODENOSCOPY (EGD) (N/A) - 12:45pm as a surgical intervention.  The patient's history has been reviewed, patient examined, no change in status, stable for surgery.  I have reviewed the patient's chart and labs.  Questions were answered to the patient's satisfaction.     Sydney Jones  No change essentially.  Note difference and Protonix versus Nexium as far she can tell.  No dysphagia.  Diagnostic EGD today per plan.  The risks, benefits, limitations, alternatives and imponderables have been reviewed with the patient. Potential for esophageal dilation, biopsy, etc. have also been reviewed.  Questions have been answered. All parties agreeable.

## 2020-05-02 NOTE — Discharge Instructions (Signed)
Colonoscopy Discharge Instructions  Read the instructions outlined below and refer to this sheet in the next few weeks. These discharge instructions provide you with general information on caring for yourself after you leave the hospital. Your doctor may also give you specific instructions. While your treatment has been planned according to the most current medical practices available, unavoidable complications occasionally occur. If you have any problems or questions after discharge, call Dr. Jena Gauss at [phone]. ACTIVITY  You may resume your regular activity, but move at a slower pace for the next 24 hours.   Take frequent rest periods for the next 24 hours.   Walking will help get rid of the air and reduce the bloated feeling in your belly (abdomen).   No driving for 24 hours (because of the medicine (anesthesia) used during the test).    Do not sign any important legal documents or operate any machinery for 24 hours (because of the anesthesia used during the test).  NUTRITION  Drink plenty of fluids.   You may resume your normal diet as instructed by your doctor.   Begin with a light meal and progress to your normal diet. Heavy or fried foods are harder to digest and may make you feel sick to your stomach (nauseated).   Avoid alcoholic beverages for 24 hours or as instructed.  MEDICATIONS  You may resume your normal medications unless your doctor tells you otherwise.  WHAT YOU CAN EXPECT TODAY  Some feelings of bloating in the abdomen.   Passage of more gas than usual.   Spotting of blood in your stool or on the toilet paper.  IF YOU HAD POLYPS REMOVED DURING THE COLONOSCOPY:  No aspirin products for 7 days or as instructed.   No alcohol for 7 days or as instructed.   Eat a soft diet for the next 24 hours.  FINDING OUT THE RESULTS OF YOUR TEST Not all test results are available during your visit. If your test results are not back during the visit, make an appointment  with your caregiver to find out the results. Do not assume everything is normal if you have not heard from your caregiver or the medical facility. It is important for you to follow up on all of your test results.  SEEK IMMEDIATE MEDICAL ATTENTION IF:  You have more than a spotting of blood in your stool.   Your belly is swollen (abdominal distention).   You are nauseated or vomiting.   You have a temperature over 101.   You have abdominal pain or discomfort that is severe or gets worse throughout the day.    GERD information provided  You do have some acid burns in your esophagus from reflux  Start taking Protonix 40 mg twice a day immediately (before breakfast and supper)  A polyp was removed from your stomach today.  Further recommendations to follow pending review of pathology report  I suspect you will still need further evaluation of your low iron levels  Office visit with [place] in 4 weeks  At patient request, I called Bari Mantis at [phone] and reviewed results   Gastroesophageal Reflux Disease, Adult Gastroesophageal reflux (GER) happens when acid from the stomach flows up into the tube that connects the mouth and the stomach (esophagus). Normally, food travels down the esophagus and stays in the stomach to be digested. However, when a person has GER, food and stomach acid sometimes move back up into the esophagus. If this becomes a more serious problem, the  person may be diagnosed with a disease called gastroesophageal reflux disease (GERD). GERD occurs when the reflux:  Happens often.  Causes frequent or severe symptoms.  Causes problems such as damage to the esophagus. When stomach acid comes in contact with the esophagus, the acid may cause soreness (inflammation) in the esophagus. Over time, GERD may create small holes (ulcers) in the lining of the esophagus. What are the causes? This condition is caused by a problem with the muscle between the esophagus and  the stomach (lower esophageal sphincter, or LES). Normally, the LES muscle closes after food passes through the esophagus to the stomach. When the LES is weakened or abnormal, it does not close properly, and that allows food and stomach acid to go back up into the esophagus. The LES can be weakened by certain dietary substances, medicines, and medical conditions, including:  Tobacco use.  Pregnancy.  Having a hiatal hernia.  Alcohol use.  Certain foods and beverages, such as coffee, chocolate, onions, and peppermint. What increases the risk? You are more likely to develop this condition if you:  Have an increased body weight.  Have a connective tissue disorder.  Use NSAID medicines. What are the signs or symptoms? Symptoms of this condition include:  Heartburn.  Difficult or painful swallowing.  The feeling of having a lump in the throat.  Abitter taste in the mouth.  Bad breath.  Having a large amount of saliva.  Having an upset or bloated stomach.  Belching.  Chest pain. Different conditions can cause chest pain. Make sure you see your health care provider if you experience chest pain.  Shortness of breath or wheezing.  Ongoing (chronic) cough or a night-time cough.  Wearing away of tooth enamel.  Weight loss. How is this diagnosed? Your health care provider will take a medical history and perform a physical exam. To determine if you have mild or severe GERD, your health care provider may also monitor how you respond to treatment. You may also have tests, including:  A test to examine your stomach and esophagus with a small camera (endoscopy).  A test thatmeasures the acidity level in your esophagus.  A test thatmeasures how much pressure is on your esophagus.  A barium swallow or modified barium swallow test to show the shape, size, and functioning of your esophagus. How is this treated? The goal of treatment is to help relieve your symptoms and to  prevent complications. Treatment for this condition may vary depending on how severe your symptoms are. Your health care provider may recommend:  Changes to your diet.  Medicine.  Surgery. Follow these instructions at home: Eating and drinking   Follow a diet as recommended by your health care provider. This may involve avoiding foods and drinks such as: ? Coffee and tea (with or without caffeine). ? Drinks that containalcohol. ? Energy drinks and sports drinks. ? Carbonated drinks or sodas. ? Chocolate and cocoa. ? Peppermint and mint flavorings. ? Garlic and onions. ? Horseradish. ? Spicy and acidic foods, including peppers, chili powder, curry powder, vinegar, hot sauces, and barbecue sauce. ? Citrus fruit juices and citrus fruits, such as oranges, lemons, and limes. ? Tomato-based foods, such as red sauce, chili, salsa, and pizza with red sauce. ? Fried and fatty foods, such as donuts, french fries, potato chips, and high-fat dressings. ? High-fat meats, such as hot dogs and fatty cuts of red and white meats, such as rib eye steak, sausage, ham, and bacon. ? High-fat dairy items,  such as whole milk, butter, and cream cheese.  Eat small, frequent meals instead of large meals.  Avoid drinking large amounts of liquid with your meals.  Avoid eating meals during the 2-3 hours before bedtime.  Avoid lying down right after you eat.  Do not exercise right after you eat. Lifestyle   Do not use any products that contain nicotine or tobacco, such as cigarettes, e-cigarettes, and chewing tobacco. If you need help quitting, ask your health care provider.  Try to reduce your stress by using methods such as yoga or meditation. If you need help reducing stress, ask your health care provider.  If you are overweight, reduce your weight to an amount that is healthy for you. Ask your health care provider for guidance about a safe weight loss goal. General instructions  Pay attention to  any changes in your symptoms.  Take over-the-counter and prescription medicines only as told by your health care provider. Do not take aspirin, ibuprofen, or other NSAIDs unless your health care provider told you to do so.  Wear loose-fitting clothing. Do not wear anything tight around your waist that causes pressure on your abdomen.  Raise (elevate) the head of your bed about 6 inches (15 cm).  Avoid bending over if this makes your symptoms worse.  Keep all follow-up visits as told by your health care provider. This is important. Contact a health care provider if:  You have: ? New symptoms. ? Unexplained weight loss. ? Difficulty swallowing or it hurts to swallow. ? Wheezing or a persistent cough. ? A hoarse voice.  Your symptoms do not improve with treatment. Get help right away if you:  Have pain in your arms, neck, jaw, teeth, or back.  Feel sweaty, dizzy, or light-headed.  Have chest pain or shortness of breath.  Vomit and your vomit looks like blood or coffee grounds.  Faint.  Have stool that is bloody or black.  Cannot swallow, drink, or eat. Summary  Gastroesophageal reflux happens when acid from the stomach flows up into the esophagus. GERD is a disease in which the reflux happens often, causes frequent or severe symptoms, or causes problems such as damage to the esophagus.  Treatment for this condition may vary depending on how severe your symptoms are. Your health care provider may recommend diet and lifestyle changes, medicine, or surgery.  Contact a health care provider if you have new or worsening symptoms.  Take over-the-counter and prescription medicines only as told by your health care provider. Do not take aspirin, ibuprofen, or other NSAIDs unless your health care provider told you to do so.  Keep all follow-up visits as told by your health care provider. This is important. This information is not intended to replace advice given to you by your health  care provider. Make sure you discuss any questions you have with your health care provider. Document Revised: 05/24/2018 Document Reviewed: 05/24/2018 Elsevier Patient Education  2020 [employer].

## 2020-05-05 ENCOUNTER — Encounter: Payer: Self-pay | Admitting: Internal Medicine

## 2020-05-05 ENCOUNTER — Other Ambulatory Visit: Payer: Self-pay

## 2020-05-05 LAB — SURGICAL PATHOLOGY

## 2020-05-26 ENCOUNTER — Telehealth: Payer: Self-pay | Admitting: *Deleted

## 2020-05-26 NOTE — Telephone Encounter (Signed)
Pt said that medication doesn't seem to be controlling her acid reflux.  Pt says she can still taste acid in her mouth.  Wants to know if she can be switched to something different.  Pt says she has stomach ache at times.  Pt currently takes Pantoprazole 40 mg twice daily.  Pt uses [facility] in [place].

## 2020-05-26 NOTE — Telephone Encounter (Signed)
Appears patient was advised to increase Protonix to 40 mg twice daily at the time of EGD on 05/02/2020.   How often is she having breakthrough symptoms?  Daily?  Any certain time of day?  Please verify that she is taking Protonix 30 minutes before breakfast and dinner.  Please also verify that she is following a strict GERD diet: Avoid fried, fatty, greasy, spicy, citrus foods. Avoid caffeine and carbonated beverages. Avoid chocolate. Try eating 4-6 small meals a day rather than 3 large meals. Do not eat within 3 hours of laying down. Prop head of bed up on wood or bricks to create a 6 inch incline.

## 2020-05-26 NOTE — Telephone Encounter (Signed)
Noted. We can stop Protonix BID and try Dexilant 60 mg daily. She can pick up a couple boxes of samples to try and let us know how this does. I will send in a Rx if this works well.  She should continue following a GERD diet. Recommend she avoid caffeine for now.

## 2020-05-26 NOTE — Telephone Encounter (Signed)
Pt has been taking Pantoprazole 40 mg bid since it was called in 04/23/20.

## 2020-05-26 NOTE — Telephone Encounter (Signed)
Spoke with pt. Pt states she is taking Pantoprazole 40 mg 1 hour before she eats. Pt is having daily Gerd symptoms which start after she eats breakfast and last until she goes to bed. When pt lays down at night, she doesn't have any Gerd symptoms. Pts Gerd symptoms(acid in mouth) are present when pt is up. Pt is following a Gerd Diet and are avoid items listed except caffeine. Caffeine is used daily. Pt was advised that she cn use brinks ect to raise the head of her bed to create a 6 in incline.

## 2020-05-26 NOTE — Telephone Encounter (Signed)
Spoke with pt. Pt notified that she can d/c the Pantoprazole and try the Dexilant 60 mg. Dexilant samples are ready for pickup.

## 2020-06-13 ENCOUNTER — Ambulatory Visit: Payer: Medicare Other | Admitting: Gastroenterology

## 2020-06-15 NOTE — Progress Notes (Signed)
Referring Provider: Georgann Housekeeper, MD Primary Care Physician:  Georgann Housekeeper, MD Primary GI Physician: Dr. Jena Gauss  Chief Complaint  Patient presents with   Gastroesophageal Reflux    med not helping    HPI:   Sydney Jones is a 74 y.o. female presenting today for follow-up of positive occult stool blood test, constipation, and epigastric abdominal pain s/p EGD.  Patient was last seen in our office 04/04/2020 for the same.  She reported new onset of intermittent epigastric pain x4 weeks worsened with food improved with milk.  Chronic history of GERD which had typically been well controlled on Nexium 40 mg daily although she noted 1 week history of bitter taste in the back of her throat.  No unintentional weight loss, appetite change, early satiety, BRBPR, melena, regular NSAID use, or alcohol.  Notably, she reported also having a positive stool test in October 2020, negative in January 2021, and then again positive in April 2021.  I did not have results at the time of her office visit.  No prior endoscopic evaluation.  Also with chronic constipation, not adequately controlled with Dulcolax laxatives, and reported history of hemorrhoids with burning and stinging at times.  Abdominal exam was benign.  Most recent hemoglobin in October 2020 was 13.5.  Recommended EGD for further evaluation.  Discussed adding colonoscopy due to positive stool test but patient declined.  Nexium was changed to Protonix 40 mg daily.  CBC and iron panel ordered.  Advised to follow GERD diet and avoid all NSAIDs.  She was provided samples of Linzess 145 mcg for constipation.  Requested 2-week progress report.  Otherwise, would follow-up after EGD.  Labs 04/08/2020: Hemoglobin 12.1, down from 13.5 in October 2020.  Iron panel with ferritin 12 (L), percent saturation 14% (L), iron 46 (low end of normal).  Recommended proceeding with EGD with likely need to circle back to colonoscopy.  May hold off on oral iron until after  procedure.  Patient was interested in increasing Protonix to 40 mg twice daily as she only noted mild improvement with Protonix daily and continued with a bad taste in the back of her throat.  EGD 05/02/2020: Mild erosive reflux esophagitis, benign-appearing gastric polyps but otherwise normal-appearing stomach s/p polypectomy and gastric mucosal biopsy, patent pylorus, normal examined duodenum.  Pathology with fundic gland polyp, mild chronic gastritis, negative for H. pylori.  Advised to increase Protonix to 40 mg twice daily and would likely need further evaluation of IDA.  Telephone call 05/26/2020 with patient stating Protonix twice daily was not controlling her acid reflux.  She was having breakthrough symptoms daily.  Also reported stomach ache at times.  She confirms she was following a GERD diet other than avoiding caffeine which was being used daily.  She was advised to stop Protonix and try Dexilant.  She was to come by the office to pick up samples.  We would send in prescription if this works well.  Today:   Epigastric Pain/GERD: Epigastric pain has resolved. Reflux is not as bad but is still there. Presents as a bad taste in her mouth which is still present daily, just a less degree. Wakes up with the taste. By bedtime it is resolved.  Taking Protonix 1 hour before breakfast and dinner. Tried Dexilant which provided no significant improvement compared to Protonix. Eats at 5:30pm and goes to bed at 8:30-9pm. Sydney Jones is her largest meal. Sleeps on pillows. Drinking half and half coffee/tea. Drinking water with flavoring in it.  No Soda.  Avoiding fried, fatty, greasy, spicy foods.  No dysphagia. No N/V. No abdominal pain.  No NSAIDs.  Constipation: Tried samples of Linzess 145 mcg. States she didn't really notice much difference. Eating some pecans and cashews which has helped her to have a soft, formed BM daily. No stools are soft and formed. No diarrhea. No blood in the stool. No black stools. No  prior colonoscopy.   OK with scheduling TCS in September/October.   June 11th and July 9th had COVID vaccine at [facility] in [place].     Past Medical History:  Diagnosis Date   Acid reflux    Anxiety    Hypertension    Thyroid disease    Hypothyroid    Past Surgical History:  Procedure Laterality Date   BIOPSY  05/02/2020   Procedure: BIOPSY;  Surgeon: Corbin Ade, MD;  Location: [facility];  Service: Endoscopy;;  gastric   CATARACT EXTRACTION     ESOPHAGOGASTRODUODENOSCOPY N/A 05/02/2020   Procedure: ESOPHAGOGASTRODUODENOSCOPY (EGD);  Surgeon: Corbin Ade, MD;  Mild erosive reflux esophagitis, benign-appearing gastric polyps but otherwise normal-appearing stomach s/p polypectomy and gastric mucosal biopsy, patent pylorus, normal examined duodenum.  Pathology with fundic gland polyp, mild chronic gastritis, negative for H. pylori.     LAPAROSCOPY  1980s   for endometriosis    perdontal     POLYPECTOMY  05/02/2020   Procedure: POLYPECTOMY;  Surgeon: Corbin Ade, MD;  Location: [facility];  Service: Endoscopy;;  gastric   WISDOM TOOTH EXTRACTION      Current Outpatient Medications  Medication Sig Dispense Refill   acetaminophen (TYLENOL) 500 MG tablet Take 1,000 mg by mouth every 6 (six) hours as needed for moderate pain or headache.     amLODipine (NORVASC) 5 MG tablet Take 5 mg by mouth 2 (two) times daily.     doxazosin (CARDURA) 2 MG tablet Take 2 mg by mouth at bedtime.     levothyroxine (SYNTHROID, LEVOTHROID) 25 MCG tablet Take 25 mcg by mouth every morning.  6   lisinopril (PRINIVIL,ZESTRIL) 40 MG tablet Take 40 mg by mouth every morning.  2   loratadine (CLARITIN) 10 MG tablet Take 10 mg by mouth every morning.     pantoprazole (PROTONIX) 40 MG tablet Take 1 tablet (40 mg total) by mouth 2 (two) times daily before a meal. 60 tablet 3   No current facility-administered medications for this visit.    Allergies as of 06/16/2020 - Review  Complete 06/16/2020  Allergen Reaction Noted   Codeine Nausea Only 06/21/2018   Tetracyclines & related Other (See Comments) 06/21/2018    Family History  Problem Relation Age of Onset   Colon cancer Neg Hx    Stomach cancer Neg Hx    Esophageal cancer Neg Hx    Pancreatic cancer Neg Hx     Social History   Socioeconomic History   Marital status: Married    Spouse name: Not on file   Number of children: Not on file   Years of education: Not on file   Highest education level: Not on file  Occupational History   Not on file  Tobacco Use   Smoking status: Current Every Day Smoker    Packs/day: 1.00    Types: Cigarettes   Smokeless tobacco: Never Used  Vaping Use   Vaping Use: Never used  Substance and Sexual Activity   Alcohol use: Not Currently   Drug use: Never   Sexual activity: Not  on file  Other Topics Concern   Not on file  Social History Narrative   Not on file   Social Determinants of Health   Financial Resource Strain:    Difficulty of Paying Living Expenses:   Food Insecurity:    Worried About Programme researcher, broadcasting/film/video in the Last Year:    Barista in the Last Year:   Transportation Needs:    Freight forwarder (Medical):    Lack of Transportation (Non-Medical):   Physical Activity:    Days of Exercise per Week:    Minutes of Exercise per Session:   Stress:    Feeling of Stress :   Social Connections:    Frequency of Communication with Friends and Family:    Frequency of Social Gatherings with Friends and Family:    Attends Religious Services:    Active Member of Clubs or Organizations:    Attends Engineer, structural:    Marital Status:     Review of Systems: Gen: Denies fever, chills, cold or flulike symptoms, presyncope, or syncope.  She does have lightheadedness with position changes.  CV: Denies chest pain. Occasional palpitations.  Resp: Denies dyspnea. Occasional cough related to smoking.    GI: See HPI  Heme: See HPI  Physical Exam: BP 140/71    Pulse 74    Temp (!) 97.4 F (36.3 C) (Oral)    Ht 5\' 4"  (1.626 m)    Wt 126 lb 9.6 oz (57.4 kg)    BMI 21.73 kg/m  General:   Alert and oriented. No distress noted. Pleasant and cooperative.  Head:  Normocephalic and atraumatic. Eyes:  Conjuctiva clear without scleral icterus. Heart:  S1, S2 present without murmurs appreciated. Lungs:  Clear to auscultation bilaterally. No wheezes, rales, or rhonchi. No distress.  Abdomen:  +BS, soft, non-tender and non-distended. No rebound or guarding. No HSM or masses noted. Msk:  Symmetrical without gross deformities. Normal posture. Extremities:  Without edema. Neurologic:  Alert and  oriented x4 Psych: Normal mood and affect.

## 2020-06-16 ENCOUNTER — Ambulatory Visit: Payer: Medicare Other | Admitting: Gastroenterology

## 2020-06-16 ENCOUNTER — Other Ambulatory Visit: Payer: Self-pay

## 2020-06-16 ENCOUNTER — Encounter: Payer: Self-pay | Admitting: Gastroenterology

## 2020-06-16 VITALS — BP 140/71 | HR 74 | Temp 97.4°F | Ht 64.0 in | Wt 126.6 lb

## 2020-06-16 DIAGNOSIS — R790 Abnormal level of blood mineral: Secondary | ICD-10-CM

## 2020-06-16 DIAGNOSIS — K5900 Constipation, unspecified: Secondary | ICD-10-CM | POA: Diagnosis not present

## 2020-06-16 DIAGNOSIS — D509 Iron deficiency anemia, unspecified: Secondary | ICD-10-CM | POA: Insufficient documentation

## 2020-06-16 DIAGNOSIS — R1013 Epigastric pain: Secondary | ICD-10-CM | POA: Diagnosis not present

## 2020-06-16 DIAGNOSIS — K2100 Gastro-esophageal reflux disease with esophagitis, without bleeding: Secondary | ICD-10-CM

## 2020-06-16 DIAGNOSIS — K219 Gastro-esophageal reflux disease without esophagitis: Secondary | ICD-10-CM | POA: Insufficient documentation

## 2020-06-16 NOTE — Assessment & Plan Note (Addendum)
Resolved with Protonix 40 mg twice daily.  EGD for evaluation on 05/02/2020 revealing mild erosive reflux esophagitis.  Also with benign-appearing gastric polyps and otherwise normal-appearing exam.  Pathology revealed fundic gland polyp and gastric biopsy with mild chronic gastritis, negative for H. Pylori.   She is advised to continue Protonix 40 mg twice daily 30 minutes before breakfast and dinner. Follow-up in the office after colonoscopy.

## 2020-06-16 NOTE — Patient Instructions (Addendum)
We will get you scheduled for colonoscopy in the near future with Dr. Jena Gauss to evaluate your iron deficiency anemia.  Please start taking iron once daily.  You may pick this up over-the-counter.  Look at the back of the bottle and ensure each tablet contains 325 mg of ferrous sulfate.  Continue to monitor for any bright red blood per rectum or black stool and let us know.  It is possible that your stools will become dark when you start iron.  To prevent constipation on iron: Drink enough of water to keep urine pale yellow to clear. Add Benefiber or Metamucil daily. If you develop constipation, use MiraLAX 1 capful (17 g) daily in 8 ounces of water. If you continue with constipation despite the above recommendations, please let me know.  It is important that your bowels are moving well prior to your colonoscopy to ensure a good prep.  For GERD symptoms: Continue Protonix 40 mg daily 30 minutes before breakfast and 30 minutes before dinner. Continue following a GERD diet:  Avoid fried, fatty, greasy, spicy, citrus foods.  Avoid caffeine and carbonated beverages.  Avoid chocolate. I recommend not eating your largest meal of the day at dinner.  You may also try eating 4-6 small meals daily rather than 3 main meals. Increase the timing between dinner and laying down to 4 hours. Prop head of bed up on wood, bricks, or bed risers to create a 6 inch incline.  We will plan to see you back after your colonoscopy.  Do not hesitate to call with questions or concerns prior.  Ermalinda Memos, PA-C [facility]

## 2020-06-16 NOTE — Assessment & Plan Note (Addendum)
Iron panel 04/08/2020 with ferritin 12 (L), percent saturation 14% (L), iron 46 (low end of normal).  Hemoglobin at that time was 12.1, down from 13.5 in October 2020.  Patient reports having a positive stool test  in October 2020, negative in January 2021, and then again positive in April 2021.  She has never had a colonoscopy.  Denies BRBPR, melena, abdominal pain, or unintentional weight loss. Bowels are moving regularly at this time.  Recent EGD 05/02/2020 for epigastric pain revealed mild erosive reflux esophagitis, benign-appearing gastric polyps but otherwise normal-appearing stomach s/p polypectomy and gastric mucosal biopsy, patent pylorus, normal examined duodenum.  Pathology with fundic gland polyp, mild chronic gastritis, negative for H. Pylori.  She is currently on Protonix 40 mg twice daily and has had resolution of epigastric pain.  GERD symptoms improving as per above.    Etiology of iron deficiency is not clear.  I recommend she had a colonoscopy to evaluate her lower GI tract for sources of occult blood loss including colon polyps and possible malignancy.  Plan: Proceed with colonoscopy with Dr. Jena Gauss in the near future. The risks, benefits, and alternatives have been discussed in detail with patient. They have stated understanding and desire to proceed.  ASA II Advised to start oral iron daily.  She will pick this up over-the-counter.  Advised she check the label to ensure each tablet contains 325 mg of ferrous sulfate.  Advise she hold iron for 7 days prior to procedure. To prevent constipation on iron:  Drink enough of water to keep urine pale yellow to clear.  Add Benefiber or Metamucil daily.  May use MiraLAX 1 capful (17 g) daily as needed.  Advised to let us know if the above recommendations do not prevent/manage constipation well. Continue to monitor for bright red blood per rectum or melena and let us know. Follow-up after colonoscopy.

## 2020-06-16 NOTE — Assessment & Plan Note (Addendum)
Chronic history of mild constipation.  Tried Linzess 145 mcg.  Per patient, she did not notice much improvement.  She has started eating pecans and cashews which has allowed her to have a soft formed BM daily.  Denies BRBPR or melena.  No abdominal pain.  No prior colonoscopy.  No family history of colon cancer.  Notably, she does have iron deficiency with declining hemoglobin but remained within normal limits at 12.1 on 04/08/2020.  Plan: Proceed with colonoscopy with Dr. Jena Gauss in the near future. The risks, benefits, and alternatives have been discussed in detail with patient. They have stated understanding and desire to proceed.  ASA II I have advised she start oral iron due to iron deficiency.  She will hold this for 7 days prior to colonoscopy. Advise she add Benefiber or Metamucil daily. May use MiraLAX 1 capful (17 g) daily in 8 ounces water if she develops constipation. Follow-up in the office after colonoscopy.

## 2020-06-16 NOTE — Assessment & Plan Note (Addendum)
Chronic history of GERD.  Patient has noted new onset of epigastric pain and bitter taste in the back of her throat in April/May of this year.  EGD 05/02/2020 with mild erosive reflux esophagitis, benign-appearing gastric polyps but otherwise normal-appearing exam.  Pathology revealed fundic gland polyp, gastric biopsy with mild chronic gastritis, negative for H. Pylori.  Protonix was increased to 40 mg twice daily. Epigastric pain has resolved.  Bad taste in the back of her throat continues to be present when waking in the morning but to a lesser degree and resolves by evening.  Notably, her largest meal of the day is dinner which is about 5:30 PM when she lays down around 8:30 PM.  Denies BRBPR, melena, or NSAID use.  We did try a short course of Dexilant in late June which patient reports did not provide any additional improvement above Protonix.  Suspect bad taste in the back of her throat in the mornings it is likely secondary to nocturnal GERD symptoms.   Plan: Continue Protonix 40 mg twice daily 30 minutes before breakfast and dinner. Do not eat largest meal at dinnertime.  May try eating 4-6 small meals daily. Do not eat within 4 hours of laying down. Prop head of bed up on wood, bricks, or bed risers to create a 6 inch incline. Follow strict GERD diet.  Counseled on this. Follow-up after colonoscopy.

## 2020-07-03 ENCOUNTER — Telehealth: Payer: Self-pay | Admitting: *Deleted

## 2020-07-03 ENCOUNTER — Encounter: Payer: Self-pay | Admitting: *Deleted

## 2020-07-03 NOTE — Telephone Encounter (Signed)
Called pt and she is scheduled for TCS with Dr. Jena Gauss 08/20/20 at 7:30am, arrival 6:30am. Patient aware will need covid prior to covid.

## 2020-07-03 NOTE — Telephone Encounter (Signed)
Received call from endo that 9/22 is 8:30am start day and patient procedure time will need to be moved.  Called pt and she has rescheduled procedure to 9/15 at 11:15am. Called endo and made aware.  New instructions/covid test mailed to pt

## 2020-08-06 ENCOUNTER — Telehealth: Payer: Self-pay | Admitting: Internal Medicine

## 2020-08-06 NOTE — Telephone Encounter (Signed)
Noted. Called endo and made aware to cancel. Will await patient call back. FYI to [employer]

## 2020-08-06 NOTE — Telephone Encounter (Signed)
Patient called and wants to cancel her procedure and will reschedule at a later date

## 2020-08-06 NOTE — Telephone Encounter (Signed)
Noted. Sydney Jones, please see if she would like to schedule a follow-up appointment. Would be ok with follow-up in November. Please also verify if she is taking oral iron. I recommended she start this at her last office visit. If so, we will need to to repeat and iron panel and CBC the week of 10/18.

## 2020-08-07 ENCOUNTER — Other Ambulatory Visit: Payer: Self-pay

## 2020-08-07 DIAGNOSIS — R790 Abnormal level of blood mineral: Secondary | ICD-10-CM

## 2020-08-07 NOTE — Telephone Encounter (Signed)
Routing to Kristen Harper, PA °

## 2020-08-07 NOTE — Telephone Encounter (Signed)
Noted  

## 2020-08-07 NOTE — Telephone Encounter (Signed)
Spoke with pt. Pt  scheduled a f/u apt. Pt is taking iron and will repeat labs on the week of 09/15/20. Orders placed and mailed to pt.

## 2020-08-12 ENCOUNTER — Other Ambulatory Visit (HOSPITAL_COMMUNITY): Payer: Medicare Other

## 2020-08-13 ENCOUNTER — Encounter (HOSPITAL_COMMUNITY): Payer: Self-pay

## 2020-08-13 ENCOUNTER — Ambulatory Visit (HOSPITAL_COMMUNITY): Admit: 2020-08-13 | Payer: Medicare Other | Admitting: Internal Medicine

## 2020-08-13 SURGERY — COLONOSCOPY
Anesthesia: Moderate Sedation

## 2020-08-18 ENCOUNTER — Other Ambulatory Visit (HOSPITAL_COMMUNITY): Payer: Medicare Other

## 2020-09-16 LAB — CBC WITH DIFFERENTIAL/PLATELET
Absolute Monocytes: 719 cells/uL (ref 200–950)
Basophils Absolute: 146 cells/uL (ref 0–200)
Basophils Relative: 1.6 %
Eosinophils Absolute: 100 cells/uL (ref 15–500)
Eosinophils Relative: 1.1 %
HCT: 38.5 % (ref 35.0–45.0)
Hemoglobin: 12.9 g/dL (ref 11.7–15.5)
Lymphs Abs: 1138 cells/uL (ref 850–3900)
MCH: 28.9 pg (ref 27.0–33.0)
MCHC: 33.5 g/dL (ref 32.0–36.0)
MCV: 86.3 fL (ref 80.0–100.0)
MPV: 11.2 fL (ref 7.5–12.5)
Monocytes Relative: 7.9 %
Neutro Abs: 6998 cells/uL (ref 1500–7800)
Neutrophils Relative %: 76.9 %
Platelets: 310 10*3/uL (ref 140–400)
RBC: 4.46 10*6/uL (ref 3.80–5.10)
RDW: 14.7 % (ref 11.0–15.0)
Total Lymphocyte: 12.5 %
WBC: 9.1 10*3/uL (ref 3.8–10.8)

## 2020-09-16 LAB — IRON,TIBC AND FERRITIN PANEL
%SAT: 29 % (calc) (ref 16–45)
Ferritin: 31 ng/mL (ref 16–288)
Iron: 89 ug/dL (ref 45–160)
TIBC: 305 mcg/dL (calc) (ref 250–450)

## 2020-09-16 NOTE — Progress Notes (Signed)
Iron panel improved and within normal limits. Ferritin still on the lower end of normal. Hemoglobin is slightly improved from 12.1 to 12.9.   She can continue taking oral iron daily for now. We will follow-up in November as scheduled. She still needs TCS which we can discuss again at time of OV.

## 2020-10-11 NOTE — Progress Notes (Signed)
Referring Provider: Georgann Housekeeper, MD Primary Care Physician:  Georgann Housekeeper, MD Primary GI Physician: Dr. Jena Gauss  Chief Complaint  Patient presents with  . Gastroesophageal Reflux    med not helping    HPI:   Sydney Jones is a 74 y.o. female with a history of constipation, positive "stool test" around April 2021 (received referral for this but no results) but declined colonoscopy, no prior colonoscopy, and GERD.  Also began to have epigastric abdominal pain in April 2021.  Decline in hemoglobin from 13.5 in October 2020 down to 12.1 in May 2021 with ferritin 12 (L), percent saturation 14% (L), iron 46 (low end of normal).  EGD June 2021 with mild erosive reflux esophagitis, benign-appearing gastric polyps s/p polypectomy and gastric mucosal biopsy, normal examined duodenum.  Pathology with fundic gland polyp, mild gastritis, negative H. Pylori.  She is presenting today for follow-up of constipation, GERD, and low iron.  She was last seen in our office 06/16/2020.  Reported epigastric pain had resolved.  Reflux had improved, but continued with bad taste in her mouth daily, worse in the mornings.  Try Dexilant but did not notice much improvement.  She had resume Protonix twice daily.  No other significant upper GI symptoms.  No NSAIDs.  Constipation improved with eating pecans and cashews daily.  Had previously tried Linzess 145 mcg and did not notice much improvement.  We discussed need for first ever colonoscopy in the setting of iron deficiency and she was agreeable.  She was also counseled on GERD lifestyle and advised to continue Protonix 40 mg twice daily for now.  Also advise she start oral iron daily.  Patient called 08/06/2020 to cancel colonoscopy.  Repeat iron panel and CBC 09/15/2020 with ferritin 31, iron 89, saturation percent 29%, hemoglobin 12.9.  Advised she continue taking oral iron daily for now and follow-up in November as scheduled.  Today: Taking Protonix 40 mg before  breakfast and dinner.  In general, symptoms are well controlled and only occurring when eating certain dietary triggers. Symptoms present as a burning in the epigastric area rather than typical heartburn or acid reflux symptoms. States she used to be able to eat whatever she wanted to and would not have any trouble.  Had bacon on Saturday and ended up with epigastric discomfort.  Also had symptoms when eating hush puppies. Can't eat tacos or pizza. No nausea or vomiting. No abdominal pain otherwise.  Not taking anything to help with breakthrough symptoms.  Not ready to have colonoscopy. She is trying to find another house. Well is running out of water and landlord doesn't want to dig another well. States she doesn't feel clean and will consider scheduling colonoscopy after she moves. Bowels are moving well with over-the-counter stool softener with gentle laxative a few times a week. No brbpr or melena. She continues to take oral iron daily.   Past Medical History:  Diagnosis Date  . Acid reflux   . Anxiety   . Hypertension   . Thyroid disease    Hypothyroid    Past Surgical History:  Procedure Laterality Date  . BIOPSY  05/02/2020   Procedure: BIOPSY;  Surgeon: Corbin Ade, MD;  Location: [facility];  Service: Endoscopy;;  gastric  . CATARACT EXTRACTION    . ESOPHAGOGASTRODUODENOSCOPY N/A 05/02/2020   Procedure: ESOPHAGOGASTRODUODENOSCOPY (EGD);  Surgeon: Corbin Ade, MD;  Mild erosive reflux esophagitis, benign-appearing gastric polyps but otherwise normal-appearing stomach s/p polypectomy and gastric mucosal biopsy, patent  pylorus, normal examined duodenum.  Pathology with fundic gland polyp, mild chronic gastritis, negative for H. pylori.    Marland Kitchen LAPAROSCOPY  1980s   for endometriosis   . perdontal    . POLYPECTOMY  05/02/2020   Procedure: POLYPECTOMY;  Surgeon: Corbin Ade, MD;  Location: [facility];  Service: Endoscopy;;  gastric  . WISDOM TOOTH EXTRACTION      Current  Outpatient Medications  Medication Sig Dispense Refill  . acetaminophen (TYLENOL) 500 MG tablet Take 1,000 mg by mouth every 6 (six) hours as needed for moderate pain or headache.    Marland Kitchen amLODipine (NORVASC) 5 MG tablet Take 5 mg by mouth 2 (two) times daily.    Marland Kitchen doxazosin (CARDURA) 2 MG tablet Take 2 mg by mouth at bedtime.    Marland Kitchen levothyroxine (SYNTHROID, LEVOTHROID) 25 MCG tablet Take 25 mcg by mouth every morning.  6  . lisinopril (PRINIVIL,ZESTRIL) 40 MG tablet Take 40 mg by mouth every morning.  2  . loratadine (CLARITIN) 10 MG tablet Take 10 mg by mouth every morning.    Marland Kitchen omeprazole (PRILOSEC) 40 MG capsule Take 1 capsule (40 mg total) by mouth 2 (two) times daily before a meal. 60 capsule 3   No current facility-administered medications for this visit.    Allergies as of 10/13/2020 - Review Complete 10/13/2020  Allergen Reaction Noted  . Codeine Nausea Only 06/21/2018  . Tetracyclines & related Other (See Comments) 06/21/2018    Family History  Problem Relation Age of Onset  . Colon cancer Neg Hx   . Stomach cancer Neg Hx   . Esophageal cancer Neg Hx   . Pancreatic cancer Neg Hx     Social History   Socioeconomic History  . Marital status: Married    Spouse name: Not on file  . Number of children: Not on file  . Years of education: Not on file  . Highest education level: Not on file  Occupational History  . Not on file  Tobacco Use  . Smoking status: Current Every Day Smoker    Packs/day: 1.00    Types: Cigarettes  . Smokeless tobacco: Never Used  Vaping Use  . Vaping Use: Never used  Substance and Sexual Activity  . Alcohol use: Not Currently  . Drug use: Never  . Sexual activity: Not on file  Other Topics Concern  . Not on file  Social History Narrative  . Not on file   Social Determinants of Health   Financial Resource Strain:   . Difficulty of Paying Living Expenses: Not on file  Food Insecurity:   . Worried About Programme researcher, broadcasting/film/video in the Last  Year: Not on file  . Ran Out of Food in the Last Year: Not on file  Transportation Needs:   . Lack of Transportation (Medical): Not on file  . Lack of Transportation (Non-Medical): Not on file  Physical Activity:   . Days of Exercise per Week: Not on file  . Minutes of Exercise per Session: Not on file  Stress:   . Feeling of Stress : Not on file  Social Connections:   . Frequency of Communication with Friends and Family: Not on file  . Frequency of Social Gatherings with Friends and Family: Not on file  . Attends Religious Services: Not on file  . Active Member of Clubs or Organizations: Not on file  . Attends Banker Meetings: Not on file  . Marital Status: Not on file  Review of Systems: Gen: Denies fever, chills, cold or flulike symptoms, lightheadedness, dizziness, presyncope, syncope. CV: Denies chest pain or palpitations. Resp: Denies dyspnea or cough. GI: See HPI Heme: See HPI  Physical Exam: BP 134/72   Pulse 78   Temp (!) 96.9 F (36.1 C) (Temporal)   Ht 5\' 4"  (1.626 m)   Wt 128 lb (58.1 kg)   BMI 21.97 kg/m  General:  Alert and oriented. No distress noted. Pleasant and cooperative.  Head:  Normocephalic and atraumatic. Eyes:  Conjuctiva clear without scleral icterus. Heart:  S1, S2 present without murmurs appreciated. Lungs:  Clear to auscultation bilaterally. No wheezes, rales, or rhonchi. No distress.  Abdomen:  +BS, soft, non-tender and non-distended. No rebound or guarding. No HSM or masses noted. Msk:  Symmetrical without gross deformities. Normal posture. Extremities:  Without edema. Neurologic:  Alert and  oriented x4 Psych:  Normal mood and affect.

## 2020-10-13 ENCOUNTER — Encounter: Payer: Self-pay | Admitting: Gastroenterology

## 2020-10-13 ENCOUNTER — Other Ambulatory Visit: Payer: Self-pay

## 2020-10-13 ENCOUNTER — Ambulatory Visit: Payer: Medicare Other | Admitting: Gastroenterology

## 2020-10-13 ENCOUNTER — Encounter: Payer: Self-pay | Admitting: Internal Medicine

## 2020-10-13 VITALS — BP 134/72 | HR 78 | Temp 96.9°F | Ht 64.0 in | Wt 128.0 lb

## 2020-10-13 DIAGNOSIS — D509 Iron deficiency anemia, unspecified: Secondary | ICD-10-CM

## 2020-10-13 DIAGNOSIS — R790 Abnormal level of blood mineral: Secondary | ICD-10-CM | POA: Diagnosis not present

## 2020-10-13 DIAGNOSIS — K2100 Gastro-esophageal reflux disease with esophagitis, without bleeding: Secondary | ICD-10-CM

## 2020-10-13 DIAGNOSIS — K5900 Constipation, unspecified: Secondary | ICD-10-CM

## 2020-10-13 DIAGNOSIS — R1013 Epigastric pain: Secondary | ICD-10-CM | POA: Diagnosis not present

## 2020-10-13 MED ORDER — OMEPRAZOLE 40 MG PO CPDR: 40.0000 mg | DELAYED_RELEASE_CAPSULE | Freq: Two times a day (BID) | ORAL | 3 refills | Status: DC

## 2020-10-13 NOTE — Patient Instructions (Signed)
Please stop Protonix and start omeprazole 40 mg twice daily 30 minutes before breakfast and dinner.  You may take Tums or over-the-counter Pepcid as needed for breakthrough reflux symptoms.  Continue to avoid known dietary triggers of reflux/upper abdominal pain.  For constipation: Please stop stool softener with gentle laxative and start Colace daily.  You will be due for repeat labs next month to follow-up on your hemoglobin and iron.  We will arrange this for you.  We will plan to see you back in 3 months.  Please call in 4 weeks with a progress report on how omeprazole and colace are working for you.   Ermalinda Memos, PA-C [facility]

## 2020-10-14 NOTE — Assessment & Plan Note (Addendum)
In May 2021, patient was found to have ferritin 12 (L), percent saturation 14% (L), iron 46 (low end of normal). Hemoglobin at that time was 12.1, down from 13.5 in October 2020. She previously reported having a positive stool test in October 2020, negative in January 2021, and then again positive in April 2021. Denies brbpr, melena, or unintentional weight loss. History of mild constipation as discussed above. Never had a colonoscopy. EGD in June 2021 for epigastric pain with mild erosive reflux esophagitis, benign-appearing gastric polyps, otherwise normal-appearing stomach s/p polypectomy. Pathology with fundic gland polyp, mild gastritis, negative H. Pylori. She has maintained on PPI BID and started on oral iron in July. Repeat labs in October 2021 with ferritin 31, iron 89, saturation percent 29%, hemoglobin 12.9.   Etiology of IDA has not been identified. She needs a colonoscopy for further evaluation. Patient is declining colonoscopy for now (see HPI for details). She will call us if she is ready to schedule prior to her follow-up in 3 months.   Plan:  Continue oral iron for now.  Repeat CBC and iron panel in December 2021.  Follow-up in 3 months. She will call us if she is ready to schedule colonoscopy prior to her follow-up.

## 2020-10-14 NOTE — Assessment & Plan Note (Addendum)
Chronic history of GERD.  Patient states symptoms present as epigastric discomfort rather than typical heartburn symptoms.  EGD June 2021 with mild erosive reflux esophagitis, benign-appearing gastric polyps, otherwise normal-appearing exam.  Pathology with fundic gland polyp, mild chronic gastritis, negative for H. pylori.  She has been on Protonix 40 mg twice daily since EGD with improvement in epigastric abdominal pain unless eating dietary triggers including fatty foods or spicy foods.  No typical GERD symptoms.  Due to epigastric discomfort with certain foods, patient feels PPI is not working and is requesting to try different medication.  I explained today that even with medications, there may be some food items that she has to continue to avoid with fried foods and spicy foods being very common GERD triggers.  Plan: Stop Protonix and trial omeprazole 40 mg twice daily. May take Tums or over-the-counter Pepcid as needed for breakthrough reflux symptoms. Continue to avoid known dietary triggers of reflux/upper abdominal pain. Requested progress report in 4 weeks. Follow-up in 3 months.

## 2020-10-14 NOTE — Assessment & Plan Note (Addendum)
Chronic history of mild constipation.  At her last visit, she felt symptoms were well controlled with eating pecans and cashews.  More recently, she started using an over-the-counter stool softener with laxative a few times a week.  No BRBPR or melena.  No abdominal pain.  No prior colonoscopy.  No family history of colon cancer.  Notably, she was found to have a decline in hemoglobin from 13.5 in October 2020 down to 12.1 in May 2021 with ferritin 12  (L), percent saturation 14% (L), iron 46 (low end of normal).  She has been on oral iron since July. Patient had been scheduled for colonoscopy 08/06/2020 but canceled.  We discussed this again today, but she states she is not ready to schedule.  See HPI for details.  She will call us when she is ready.  Mild worsening of constipation requiring stool softener with laxative may be secondary to oral iron.   Plan: Stop stool softener with gentle laxative and start Colace daily as patient doesn't want to try MiraLAX. Requested progress report in 4 weeks.  Follow-up in 3 months.

## 2020-10-14 NOTE — Assessment & Plan Note (Signed)
Addressed under GERD 

## 2020-11-10 ENCOUNTER — Telehealth: Payer: Self-pay | Admitting: Internal Medicine

## 2020-11-10 NOTE — Telephone Encounter (Signed)
PATIENT CALLED AND SAID THAT OMEPRAZOLE DID NOT WORK AND SHE WENT BACK TO TAKING PANTOPROZOLE.

## 2020-11-10 NOTE — Telephone Encounter (Signed)
Noted  

## 2020-11-10 NOTE — Telephone Encounter (Signed)
Spoke with pt. Pt d/c Omeprazole due to felling her reflux wasn't controlled on it. Pt started back on the Pantoprazole 40 mg and is also taking Pepcid at night and avoid food triggers. Per pt, if she doesn't have any improvement, she will call back.

## 2020-11-20 LAB — CBC WITH DIFFERENTIAL/PLATELET
Absolute Monocytes: 552 cells/uL (ref 200–950)
Basophils Absolute: 144 cells/uL (ref 0–200)
Basophils Relative: 1.8 %
Eosinophils Absolute: 112 cells/uL (ref 15–500)
Eosinophils Relative: 1.4 %
HCT: 38.1 % (ref 35.0–45.0)
Hemoglobin: 13 g/dL (ref 11.7–15.5)
Lymphs Abs: 1352 cells/uL (ref 850–3900)
MCH: 30 pg (ref 27.0–33.0)
MCHC: 34.1 g/dL (ref 32.0–36.0)
MCV: 87.8 fL (ref 80.0–100.0)
MPV: 11.4 fL (ref 7.5–12.5)
Monocytes Relative: 6.9 %
Neutro Abs: 5840 cells/uL (ref 1500–7800)
Neutrophils Relative %: 73 %
Platelets: 311 10*3/uL (ref 140–400)
RBC: 4.34 10*6/uL (ref 3.80–5.10)
RDW: 13.2 % (ref 11.0–15.0)
Total Lymphocyte: 16.9 %
WBC: 8 10*3/uL (ref 3.8–10.8)

## 2020-11-20 LAB — IRON,TIBC AND FERRITIN PANEL
%SAT: 16 % (calc) (ref 16–45)
Ferritin: 42 ng/mL (ref 16–288)
Iron: 44 ug/dL — ABNORMAL LOW (ref 45–160)
TIBC: 271 mcg/dL (calc) (ref 250–450)

## 2020-12-22 ENCOUNTER — Telehealth: Payer: Self-pay

## 2020-12-22 NOTE — Telephone Encounter (Signed)
Received refill request from [facility] for Protonix 40mg  twice daily (see previous phone note, pt stopped Omeprazole and went back to taking Protonix).

## 2020-12-23 ENCOUNTER — Other Ambulatory Visit: Payer: Self-pay | Admitting: Gastroenterology

## 2020-12-23 DIAGNOSIS — K2100 Gastro-esophageal reflux disease with esophagitis, without bleeding: Secondary | ICD-10-CM

## 2020-12-23 MED ORDER — PANTOPRAZOLE SODIUM 40 MG PO TBEC: 40.0000 mg | DELAYED_RELEASE_TABLET | Freq: Two times a day (BID) | ORAL | 5 refills | Status: DC

## 2020-12-23 NOTE — Telephone Encounter (Signed)
Rx sent 

## 2021-01-14 ENCOUNTER — Ambulatory Visit: Payer: Medicare Other | Admitting: Gastroenterology

## 2021-02-18 DIAGNOSIS — J439 Emphysema, unspecified: Secondary | ICD-10-CM | POA: Diagnosis not present

## 2021-02-18 DIAGNOSIS — E039 Hypothyroidism, unspecified: Secondary | ICD-10-CM | POA: Diagnosis not present

## 2021-02-18 DIAGNOSIS — I10 Essential (primary) hypertension: Secondary | ICD-10-CM | POA: Diagnosis not present

## 2021-02-18 DIAGNOSIS — E7800 Pure hypercholesterolemia, unspecified: Secondary | ICD-10-CM | POA: Diagnosis not present

## 2021-02-18 DIAGNOSIS — K219 Gastro-esophageal reflux disease without esophagitis: Secondary | ICD-10-CM | POA: Diagnosis not present

## 2021-02-20 ENCOUNTER — Ambulatory Visit: Payer: Medicare Other | Admitting: Gastroenterology

## 2021-02-20 ENCOUNTER — Encounter: Payer: Self-pay | Admitting: Gastroenterology

## 2021-02-20 ENCOUNTER — Other Ambulatory Visit: Payer: Self-pay

## 2021-02-20 VITALS — BP 127/73 | HR 74 | Temp 96.2°F | Ht 64.0 in | Wt 127.4 lb

## 2021-02-20 DIAGNOSIS — D508 Other iron deficiency anemias: Secondary | ICD-10-CM

## 2021-02-20 DIAGNOSIS — K219 Gastro-esophageal reflux disease without esophagitis: Secondary | ICD-10-CM | POA: Diagnosis not present

## 2021-02-20 NOTE — Patient Instructions (Signed)
We will recheck iron stores in May/June 2022!  We will see you back in 6 months.  Please call if you decide to pursue a colonoscopy!  It was a pleasure to see you today. I want to create trusting relationships with patients to provide genuine, compassionate, and quality care. I value your feedback. If you receive a survey regarding your visit,  I greatly appreciate you taking time to fill this out.   Gelene Mink, PhD, ANP-BC [facility]

## 2021-02-20 NOTE — Progress Notes (Signed)
Referring Provider: Georgann Housekeeper, MD Primary Care Physician:  Georgann Housekeeper, MD Primary GI: Dr. Jena Gauss   Chief Complaint  Patient presents with  . Gastroesophageal Reflux    "little bit"    HPI:   Sydney Jones is a 75 y.o. female presenting today with a history of chronic GERD, mild constipation, IDA with EGD on file June 2021 (erosive reflux esophagitis, negative H.pylori), and has declined colonoscopy in the past. Heme positive stool in the past.   Most recent labs Dec 2021: Hgb 13, ferritin improved to 42 from 12 last year. Iron low at 44.   Had been on pantoprazole, switched to omeprazole (which did not help) and now back to pantoprazole BID. Watching what she eats. Taking iron daily. GERD controlled. No overt GI bleeding. No abdominal pain, N/V. No dysphagia.  She states her well is drying up, and landlord has no plans for alternate water source. She uses outside water to flush toilets and take pan baths. This is why she does not feel she is able to pursue a colonoscopy right now.   Past Medical History:  Diagnosis Date  . Acid reflux   . Anxiety   . Hypertension   . Thyroid disease    Hypothyroid    Past Surgical History:  Procedure Laterality Date  . BIOPSY  05/02/2020   Procedure: BIOPSY;  Surgeon: Corbin Ade, MD;  [facility];  Service: Endoscopy;;  gastric  . CATARACT EXTRACTION    . ESOPHAGOGASTRODUODENOSCOPY N/A 05/02/2020   Procedure: ESOPHAGOGASTRODUODENOSCOPY (EGD);  Surgeon: Corbin Ade, MD;  Mild erosive reflux esophagitis, benign-appearing gastric polyps but otherwise normal-appearing stomach s/p polypectomy and gastric mucosal biopsy, patent pylorus, normal examined duodenum.  Pathology with fundic gland polyp, mild chronic gastritis, negative for H. pylori.    Marland Kitchen LAPAROSCOPY  1980s   for endometriosis   . perdontal    . POLYPECTOMY  05/02/2020   Procedure: POLYPECTOMY;  Surgeon: Corbin Ade, MD;  [facility];   Service: Endoscopy;;  gastric  . WISDOM TOOTH EXTRACTION      Current Outpatient Medications  Medication Sig Dispense Refill  . acetaminophen (TYLENOL) 500 MG tablet Take 1,000 mg by mouth every 6 (six) hours as needed for moderate pain or headache.    Marland Kitchen amLODipine (NORVASC) 5 MG tablet Take 5 mg by mouth 2 (two) times daily.    Marland Kitchen doxazosin (CARDURA) 2 MG tablet Take 2 mg by mouth at bedtime.    Marland Kitchen levothyroxine (SYNTHROID, LEVOTHROID) 25 MCG tablet Take 25 mcg by mouth every morning.  6  . lisinopril (PRINIVIL,ZESTRIL) 40 MG tablet Take 40 mg by mouth every morning.  2  . loratadine (CLARITIN) 10 MG tablet Take 10 mg by mouth every morning.    . pantoprazole (PROTONIX) 40 MG tablet Take 1 tablet (40 mg total) by mouth 2 (two) times daily. 60 tablet 5   No current facility-administered medications for this visit.    Allergies as of 02/20/2021 - Review Complete 02/20/2021  Allergen Reaction Noted  . Codeine Nausea Only 06/21/2018  . Tetracyclines & related Other (See Comments) 06/21/2018    Family History  Problem Relation Age of Onset  . Colon cancer Neg Hx   . Stomach cancer Neg Hx   . Esophageal cancer Neg Hx   . Pancreatic cancer Neg Hx     Social History   Socioeconomic History  . Marital status: Married    Spouse name: Not on file  .  Number of children: Not on file  . Years of education: Not on file  . Highest education level: Not on file  Occupational History  . Not on file  Tobacco Use  . Smoking status: Current Every Day Smoker    Packs/day: 1.00    Types: Cigarettes  . Smokeless tobacco: Never Used  Vaping Use  . Vaping Use: Never used  Substance and Sexual Activity  . Alcohol use: Not Currently  . Drug use: Never  . Sexual activity: Not on file  Other Topics Concern  . Not on file  Social History Narrative  . Not on file   Social Determinants of Health   Financial Resource Strain: Not on file  Food Insecurity: Not on file  Transportation Needs:  Not on file  Physical Activity: Not on file  Stress: Not on file  Social Connections: Not on file    Review of Systems: Gen: Denies fever, chills, anorexia. Denies fatigue, weakness, weight loss.  CV: Denies chest pain, palpitations, syncope, peripheral edema, and claudication. Resp: Denies dyspnea at rest, cough, wheezing, coughing up blood, and pleurisy. GI: see HPI Derm: Denies rash, itching, dry skin Psych: Denies depression, anxiety, memory loss, confusion. No homicidal or suicidal ideation.  Heme: Denies bruising, bleeding, and enlarged lymph nodes.  Physical Exam: BP 127/73   Pulse 74   Temp (!) 96.2 F (35.7 C) (Temporal)   Ht 5\' 4"  (1.626 m)   Wt 127 lb 6.4 oz (57.8 kg)   BMI 21.87 kg/m  General:   Alert and oriented. No distress noted. Pleasant and cooperative.  Head:  Normocephalic and atraumatic. Eyes:  Conjuctiva clear without scleral icterus. Mouth:  Mask in place Abdomen:  +BS, soft, non-tender and non-distended. No rebound or guarding. No HSM or masses noted. Msk:  Symmetrical without gross deformities. Normal posture. Extremities:  Without edema. Neurologic:  Alert and  oriented x4 Psych:  Alert and cooperative. Normal mood and affect.  ASSESSMENT: Sydney Jones is a 75 y.o. female presenting today with history of chronic GERD, heme positive stool, IDA that has improved on oral iron, s/p EGD last year but no colonoscopy completed as of yet.  She is doing well with Protonix BID, controlling GERD symptoms. No overt GI bleeding. Unfortunately, her landlord has not provided adequate source for water in their home as the well is drying up; she has had to use outside water for pan baths and flushing toilets. For this reason, she is declining a colonoscopy due to the prep.   We discussed at length the possibility for occult malignancy, polyp, etc.. She is still desiring to hold off on colonoscopy until situation improves and is fully aware of risks.    PLAN:   Recheck CBC and iron studies in May/June 2022 Continue PPI BID Return in 6 months Call when able to pursue colonoscopy  July 2022, PhD, ANP-BC [facility]

## 2021-03-18 DIAGNOSIS — I10 Essential (primary) hypertension: Secondary | ICD-10-CM | POA: Diagnosis not present

## 2021-03-18 DIAGNOSIS — E7800 Pure hypercholesterolemia, unspecified: Secondary | ICD-10-CM | POA: Diagnosis not present

## 2021-03-18 DIAGNOSIS — J439 Emphysema, unspecified: Secondary | ICD-10-CM | POA: Diagnosis not present

## 2021-03-18 DIAGNOSIS — E039 Hypothyroidism, unspecified: Secondary | ICD-10-CM | POA: Diagnosis not present

## 2021-03-18 DIAGNOSIS — K219 Gastro-esophageal reflux disease without esophagitis: Secondary | ICD-10-CM | POA: Diagnosis not present

## 2021-03-25 DIAGNOSIS — E871 Hypo-osmolality and hyponatremia: Secondary | ICD-10-CM | POA: Diagnosis not present

## 2021-03-25 DIAGNOSIS — K219 Gastro-esophageal reflux disease without esophagitis: Secondary | ICD-10-CM | POA: Diagnosis not present

## 2021-03-25 DIAGNOSIS — R7303 Prediabetes: Secondary | ICD-10-CM | POA: Diagnosis not present

## 2021-03-25 DIAGNOSIS — J439 Emphysema, unspecified: Secondary | ICD-10-CM | POA: Diagnosis not present

## 2021-03-25 DIAGNOSIS — I10 Essential (primary) hypertension: Secondary | ICD-10-CM | POA: Diagnosis not present

## 2021-03-25 DIAGNOSIS — E039 Hypothyroidism, unspecified: Secondary | ICD-10-CM | POA: Diagnosis not present

## 2021-05-07 DIAGNOSIS — E7800 Pure hypercholesterolemia, unspecified: Secondary | ICD-10-CM | POA: Diagnosis not present

## 2021-05-07 DIAGNOSIS — K219 Gastro-esophageal reflux disease without esophagitis: Secondary | ICD-10-CM | POA: Diagnosis not present

## 2021-05-07 DIAGNOSIS — I10 Essential (primary) hypertension: Secondary | ICD-10-CM | POA: Diagnosis not present

## 2021-05-07 DIAGNOSIS — E039 Hypothyroidism, unspecified: Secondary | ICD-10-CM | POA: Diagnosis not present

## 2021-05-07 DIAGNOSIS — J439 Emphysema, unspecified: Secondary | ICD-10-CM | POA: Diagnosis not present

## 2021-05-11 ENCOUNTER — Telehealth: Payer: Self-pay | Admitting: Internal Medicine

## 2021-05-11 NOTE — Telephone Encounter (Signed)
PLEASE CALL PATIENT ABOUT HER STOMACH PAIN [phone]

## 2021-05-12 ENCOUNTER — Telehealth: Payer: Self-pay | Admitting: Internal Medicine

## 2021-05-12 DIAGNOSIS — F17210 Nicotine dependence, cigarettes, uncomplicated: Secondary | ICD-10-CM | POA: Diagnosis not present

## 2021-05-12 DIAGNOSIS — R10816 Epigastric abdominal tenderness: Secondary | ICD-10-CM | POA: Diagnosis not present

## 2021-05-12 DIAGNOSIS — J449 Chronic obstructive pulmonary disease, unspecified: Secondary | ICD-10-CM | POA: Diagnosis not present

## 2021-05-12 DIAGNOSIS — R9431 Abnormal electrocardiogram [ECG] [EKG]: Secondary | ICD-10-CM | POA: Diagnosis not present

## 2021-05-12 DIAGNOSIS — R918 Other nonspecific abnormal finding of lung field: Secondary | ICD-10-CM | POA: Diagnosis not present

## 2021-05-12 DIAGNOSIS — E785 Hyperlipidemia, unspecified: Secondary | ICD-10-CM | POA: Diagnosis not present

## 2021-05-12 DIAGNOSIS — R079 Chest pain, unspecified: Secondary | ICD-10-CM | POA: Diagnosis not present

## 2021-05-12 DIAGNOSIS — K219 Gastro-esophageal reflux disease without esophagitis: Secondary | ICD-10-CM | POA: Diagnosis not present

## 2021-05-12 DIAGNOSIS — E039 Hypothyroidism, unspecified: Secondary | ICD-10-CM | POA: Diagnosis not present

## 2021-05-12 DIAGNOSIS — I10 Essential (primary) hypertension: Secondary | ICD-10-CM | POA: Diagnosis not present

## 2021-05-12 DIAGNOSIS — I700 Atherosclerosis of aorta: Secondary | ICD-10-CM | POA: Diagnosis not present

## 2021-05-12 DIAGNOSIS — Z720 Tobacco use: Secondary | ICD-10-CM | POA: Diagnosis not present

## 2021-05-12 NOTE — Telephone Encounter (Signed)
Pt called again today asking if the nurse was going to call her back. She had called yesterday about her abd pain. Dr Queen Blossom nurse said it wasn't in her box and wasn't aware. Pt is a [facility] patient so it may be in the [facility] nurse box. Please call patient. [phone]

## 2021-05-12 NOTE — Telephone Encounter (Signed)
Phoned and spoke with the Sydney Jones and gave Sydney Jones's recommendations to her. Sydney Jones states she does take Protonix BID. Also to take pepcid prn for reflux exacerbation. Advised again to go to ER to be evaluated for her severe abd pain. Sydney Jones finally agreed.    Per Tobi Bastos Sydney Jones needs earlier appt than 08/20/2021.

## 2021-05-12 NOTE — Telephone Encounter (Signed)
Abdominal pain is new. She was not having this at last visit. She should already be on Protonix BID. Needs earlier appointment. Recommend ED evaluation for severe abdominal pain. If resolved, may monitor closely but present to ED immediately if recurrence. May take Pepcid prn reflux exacerbation.

## 2021-05-12 NOTE — Telephone Encounter (Signed)
Pt said that she had severe stomach pain early Monday morning.  It eased off.  She went to [facility] and it hit again.  She started to sweat and almost passed out.  Stomach still aching today.  Pantoprazole not working either.  Discomfort from acid reflux.  Tasting a lot of acid in back of throat.  Poor appetite.  Takes iron so she takes stool softener and laxative combo to help with constipation.  No fever, n/v, or rectal bleeding.  Pt wants [place] to recommend something to help.  Last bm yesterday and it was normal.

## 2021-05-15 ENCOUNTER — Telehealth: Payer: Self-pay | Admitting: Internal Medicine

## 2021-05-15 DIAGNOSIS — R109 Unspecified abdominal pain: Secondary | ICD-10-CM

## 2021-05-15 NOTE — Telephone Encounter (Signed)
Please call patient , she has an upcoming appointment and stated she went to the er having abdominal pain and they checked all but her gallbladder.  She is wanting to get that checked as well

## 2021-05-18 ENCOUNTER — Telehealth: Payer: Self-pay

## 2021-05-18 NOTE — Telephone Encounter (Signed)
Phoned and spoke to the pt and was advised by her the Dr never said anything about doing CT/ checking gallbladder (that's why she left). Pt stated all they kept doing was blood draws and EKG's and she really got frustrated with that. The pt wants to have a CT abd/pelvis. Declines bloodwork for now. States if the CT doesn't show anything then she will do blood work. She states she had an attack this morning of pain radiating from abd to chest and it went away. She states over the weekend she felt fine. So at this point she wants CT abd/pelvis.

## 2021-05-18 NOTE — Telephone Encounter (Signed)
error 

## 2021-05-18 NOTE — Telephone Encounter (Signed)
ED evaluation at [facility] reviewed.  CBC without leukocytosis or anemia. LFTs normal. Lipase mildly elevated at 93.   So, it appears she did not stay for full testing? No abdominal imaging was done.   At this point: we can recheck lipase. If persistent pain, recommend CT abd/pelvis with oral contrast. Keep plans for visit with [facility] upcoming. IF she has worsening pain or SEVERE pain, would need to go to the ED. Although lipase was mildly elevated and not clinically consistent with pancreatitis, we don't know what it was PRIOR to ED visit and could have been improved from previously. Raises suspicion for pancreatitis resolving potentially.

## 2021-05-18 NOTE — Telephone Encounter (Signed)
Pt was seen in the ED last week and did not stay to finish being worked up. All of her testing had not returned and she told them she was going to follow up with her PCP but contacted [place] instead. The pt was upset because she wanted her gallbladder checked but she didn't stay the course to let them finish. Please advise

## 2021-05-19 NOTE — Addendum Note (Signed)
Addended by: Armstead Peaks on: 05/19/2021 04:44 PM   Modules accepted: Orders

## 2021-05-19 NOTE — Telephone Encounter (Signed)
Called pt, left detail VM with CT appt details 7/12 at 9:00am, arrival 8:45am, npo 4 hrs prior, p/u oral prep from radiology  Letter also mailed with appt.

## 2021-05-19 NOTE — Telephone Encounter (Signed)
[facility] clinical pool: please arrange CT abd/pelvis with oral contrast only. Dx: elevated lipase, abdominal pain.  She needs to go to ED if recurrent severe pain.

## 2021-05-19 NOTE — Telephone Encounter (Signed)
Pt made aware to go to the ER if her pain gets worse.

## 2021-06-06 NOTE — Progress Notes (Addendum)
Referring Provider: Georgann Housekeeper, MD Primary Care Physician:  Georgann Housekeeper, MD Primary GI Physician: Dr. Jena Gauss  Chief Complaint  Patient presents with   Abdominal Pain    Pain so bad she almost passed out at [facility], constipation, bloody vaginal discharge x 10 years but subsided recently    HPI:   Sydney Jones is a 75 y.o. female with history of GERD, reflux esophagitis, mild constipation, IDA, heme positive stool remotely.  EGD June 2021 with mild reflux esophagitis, fundic gland polyps, mild gastritis, negative H. pylori.  No prior colonoscopy as patient has declined.  She is presenting today for follow-up.  Last seen in our office March 2022.  Reported GERD was well controlled on Protonix twice daily and watching what she ate.  Denies any other significant symptoms.  Not interested in pursuing colonoscopy due to issues with well drying up and having to take pain baths.  Recommended continuing current medications and repeat CBC and iron studies in May/June 2022.  Patient called 6/14 reporting abdominal pain.  Recommended ER evaluation for severe pain.  She was seen at [facility] on 6/14.  She had blood work completed but ultimately decided to leave before all testing was back.  Reviewed lab results.  Mild hyponatremia with sodium 130.  LFTs within normal limits.  Lipase elevated at 93.  CBC essentially normal.  Troponins negative x2.  Patient called 6/17 requesting for her gallbladder to be evaluated.  Recommended rechecking lipase and CT A/P with oral contrast if persistent pain.  CT is scheduled for 06/09/21.  She declined repeat blood work.   Today:   Abdominal pain: Around 1st week of June severe epigastric abdominal pain woke her up at 1:30am. Wore off and went to sleep. Woke up that morning to go to [facility]. Had a gnawing pain, but began to worsen at [facility], so so severe that she almost passed out. Associated nauseated and was sweating. Since that time, she has had  intermittent epigastric discomfort after eating. Can be once a week or so. Last a couple hours. Feels like an ache, 3-4/10 in severity. No nausea. No vomiting. No abdominal pain today.   GERD: Currently on Protonix 40 mg twice daily. Well controlled. No dysphagia or odynophagia.   Bms daily to every other day. Taking stools softener daily. Working fairly well controlled. No brbpr or melena.   Reports abnormal bloody vaginal discharge that started 10 years ago. Daily. Stopped 1-2 months ago. Didn't see OB/GYN.  States she put a lot of her health problems on the back burner when she was taking care of other family members.  She is interested in evaluation at this time.  Prefers to stay local and female.   Continues to have inadequate source of water at home.  Requiring pain baths.  Does not want to have colonoscopy right now.  Trying to find a new place to live.  No NSAIDs.   Past Medical History:  Diagnosis Date   Acid reflux    Anxiety    Hypertension    Thyroid disease    Hypothyroid    Past Surgical History:  Procedure Laterality Date   BIOPSY  05/02/2020   Procedure: BIOPSY;  Surgeon: Corbin Ade, MD;  [facility];  Service: Endoscopy;;  gastric   CATARACT EXTRACTION     ESOPHAGOGASTRODUODENOSCOPY N/A 05/02/2020   Procedure: ESOPHAGOGASTRODUODENOSCOPY (EGD);  Surgeon: Corbin Ade, MD;  Mild erosive reflux esophagitis, benign-appearing gastric polyps but otherwise normal-appearing stomach  s/p polypectomy and gastric mucosal biopsy, patent pylorus, normal examined duodenum.  Pathology with fundic gland polyp, mild chronic gastritis, negative for H. pylori.     LAPAROSCOPY  1980s   for endometriosis    perdontal     POLYPECTOMY  05/02/2020   Procedure: POLYPECTOMY;  Surgeon: Corbin Ade, MD;  [facility];  Service: Endoscopy;;  gastric   WISDOM TOOTH EXTRACTION      Current Outpatient Medications  Medication Sig Dispense Refill   acetaminophen  (TYLENOL) 500 MG tablet Take 1,000 mg by mouth as needed for moderate pain or headache.     amLODipine (NORVASC) 5 MG tablet Take 5 mg by mouth 2 (two) times daily.     docusate sodium (COLACE) 100 MG capsule Take 100 mg by mouth daily.     doxazosin (CARDURA) 2 MG tablet Take 2 mg by mouth at bedtime.     Ferrous Sulfate (IRON) 325 (65 Fe) MG TABS Take by mouth every other day.     levothyroxine (SYNTHROID, LEVOTHROID) 25 MCG tablet Take 25 mcg by mouth every morning.  6   lisinopril (PRINIVIL,ZESTRIL) 40 MG tablet Take 40 mg by mouth every morning.  2   loratadine (CLARITIN) 10 MG tablet Take 10 mg by mouth every morning.     pantoprazole (PROTONIX) 40 MG tablet Take 1 tablet (40 mg total) by mouth 2 (two) times daily. 60 tablet 5   No current facility-administered medications for this visit.    Allergies as of 06/08/2021 - Review Complete 06/08/2021  Allergen Reaction Noted   Codeine Nausea Only 06/21/2018   Tetracyclines & related Other (See Comments) 06/21/2018    Family History  Problem Relation Age of Onset   Colon cancer Neg Hx    Stomach cancer Neg Hx    Esophageal cancer Neg Hx    Pancreatic cancer Neg Hx     Social History   Socioeconomic History   Marital status: Married    Spouse name: Not on file   Number of children: Not on file   Years of education: Not on file   Highest education level: Not on file  Occupational History   Not on file  Tobacco Use   Smoking status: Every Day    Packs/day: 1.00    Pack years: 0.00    Types: Cigarettes   Smokeless tobacco: Never  Vaping Use   Vaping Use: Never used  Substance and Sexual Activity   Alcohol use: Not Currently   Drug use: Never   Sexual activity: Not on file  Other Topics Concern   Not on file  Social History Narrative   Not on file   Social Determinants of Health   Financial Resource Strain: Not on file  Food Insecurity: Not on file  Transportation Needs: Not on file  Physical Activity: Not on  file  Stress: Not on file  Social Connections: Not on file    Review of Systems: Gen: Denies fever, chills, cold or flulike symptoms, presyncope or syncope. CV: Denies chest pain or palpitations. Resp: Denies dyspnea or cough. GI: See HPI Heme: See HPI  Physical Exam: BP 123/69   Pulse 84   Temp 97.7 F (36.5 C) (Temporal)   Ht 5\' 4"  (1.626 m)   Wt 127 lb 3.2 oz (57.7 kg)   BMI 21.83 kg/m  General:   Alert and oriented. No distress noted. Pleasant and cooperative.  Head:  Normocephalic and atraumatic. Eyes:  Conjuctiva clear without scleral icterus. Heart:  S1, S2 present without murmurs appreciated. Lungs:  Clear to auscultation bilaterally. No wheezes, rales, or rhonchi. No distress.  Abdomen:  +BS, soft, non-tender and non-distended. No rebound or guarding. No HSM or masses noted. Msk:  Symmetrical without gross deformities. Normal posture. Extremities:  Without edema. Neurologic:  Alert and  oriented x4 Psych:  Normal mood and affect.   Assessment: 76 y.o. female with history of GERD, reflux esophagitis, mild constipation, IDA, heme positive stool remotely presenting today for routine follow-up and further evaluation of epigastric abdominal pain.  Epigastric abdominal pain: Acute onset severe epigastric abdominal pain around the first week of June that woke her from sleep with associated nausea without vomiting. Resulted in ED evaluation on 6/14. Labs with LFTs normal, hemoglobin and WBC normal, lipase elevated at 93. Patient left prior to further evaluation.  She is scheduled for CT A/P with contrast tomorrow for further evaluation of her symptoms.  Reports since ED evaluation, she has continued with intermittent, less severe, postprandial upper abdominal discomfort lasting a couple hours at a time, now without nausea or vomiting.  GERD has remained well controlled the entire time on PPI twice daily.  No NSAIDs.  No alcohol.  Abdominal exam today is benign. EGD on file June  2021 with mild reflux esophagitis, fundic gland polyps, mild gastritis, negative H. pylori.   Query gallbladder/biliary etiology vs gastritis vs mild pancreatitis. Less likely PUD with PPI BID. Agree with proceeding with CT to further evaluate her symptoms and follow-up on elevated lipase. If negative, consider HIDA scan.  GERD: Controlled on PPI twice daily.  Continue current medications.  Iron deficiency without anemia: Found to have low ferritin and percent saturation in May 2021 with hemoglobin 12.9 at that time.  Reported heme positive stool in October 2020, negative January 2021, positive April 2021.  EGD June 2021 with mild reflux esophagitis, fundic gland polyps, mild gastritis, negative H. pylori.  No prior colonoscopy.  Patient has continued to decline colonoscopy due to landlord not providing adequate source of water, her well drying up, and having to use outside water for pan baths. She has continued to deny overt GI bleeding.  No NSAIDs.  Maintained on oral iron with most recent hemoglobin 13.4 on 6/14.  Notably, she does report a 10-year history of abnormal vaginal bleeding that has never been evaluated by OB/GYN.  This self resolved 1-2 months ago, but is interested in evaluation at this time.   Plan: 1.  Proceed with CT A/P with contrast as already scheduled on 06/09/2021. 2.  Update HFP and lipase. 3.  Update iron panel. 4.  Refer to OB/GYN for further evaluation of abnormal vaginal bleeding. 5.  Continue Protonix 40 mg twice daily. 6.  Continue iron for now. 7.  Continue to avoid all NSAIDs. 8.  Further recommendations to follow CT and lab results.    Ermalinda Memos, PA-C [facility] 06/08/2021

## 2021-06-08 ENCOUNTER — Other Ambulatory Visit: Payer: Self-pay | Admitting: Gastroenterology

## 2021-06-08 ENCOUNTER — Ambulatory Visit: Payer: Medicare Other | Admitting: Gastroenterology

## 2021-06-08 ENCOUNTER — Encounter: Payer: Self-pay | Admitting: Gastroenterology

## 2021-06-08 ENCOUNTER — Other Ambulatory Visit: Payer: Self-pay

## 2021-06-08 VITALS — BP 123/69 | HR 84 | Temp 97.7°F | Ht 64.0 in | Wt 127.2 lb

## 2021-06-08 DIAGNOSIS — K219 Gastro-esophageal reflux disease without esophagitis: Secondary | ICD-10-CM

## 2021-06-08 DIAGNOSIS — R1013 Epigastric pain: Secondary | ICD-10-CM | POA: Diagnosis not present

## 2021-06-08 DIAGNOSIS — N939 Abnormal uterine and vaginal bleeding, unspecified: Secondary | ICD-10-CM

## 2021-06-08 DIAGNOSIS — E611 Iron deficiency: Secondary | ICD-10-CM

## 2021-06-08 DIAGNOSIS — K2100 Gastro-esophageal reflux disease with esophagitis, without bleeding: Secondary | ICD-10-CM

## 2021-06-08 NOTE — Progress Notes (Signed)
Amb re 

## 2021-06-08 NOTE — Patient Instructions (Signed)
Please stop at [facility] today for pickup contrast for your CT scheduled for tomorrow.  I have also ordered blood work.  You may have this completed at [facility] today or tomorrow when you go for your CT.  Continue pantoprazole 40 mg twice daily 30 minutes before breakfast and dinner.  Follow a GERD diet:  Avoid fried, fatty, greasy, spicy, citrus foods. Avoid caffeine and carbonated beverages. Avoid chocolate. Try eating 4-6 small meals a day rather than 3 large meals. Do not eat within 3 hours of laying down. Prop head of bed up on wood or bricks to create a 6 inch incline.  We are referring you to OB/GYN for further evaluation of bloody vaginal discharge.  Continue oral iron for now.  Continue to avoid all NSAID products.  With recommendations to follow CT and labs.    Ermalinda Memos, PA-C [facility]

## 2021-06-09 ENCOUNTER — Other Ambulatory Visit (HOSPITAL_COMMUNITY)
Admission: RE | Admit: 2021-06-09 | Discharge: 2021-06-09 | Disposition: A | Discharge status: Home / Self Care | Payer: Medicare Other | Source: Ambulatory Visit | Attending: Gastroenterology | Admitting: Gastroenterology

## 2021-06-09 ENCOUNTER — Other Ambulatory Visit: Payer: Self-pay

## 2021-06-09 ENCOUNTER — Ambulatory Visit (HOSPITAL_COMMUNITY)
Admission: RE | Admit: 2021-06-09 | Discharge: 2021-06-09 | Disposition: A | Discharge status: Home / Self Care | Payer: Medicare Other | Source: Ambulatory Visit | Attending: Gastroenterology | Admitting: Gastroenterology

## 2021-06-09 DIAGNOSIS — K7689 Other specified diseases of liver: Secondary | ICD-10-CM | POA: Diagnosis not present

## 2021-06-09 DIAGNOSIS — D509 Iron deficiency anemia, unspecified: Secondary | ICD-10-CM | POA: Insufficient documentation

## 2021-06-09 DIAGNOSIS — R1909 Other intra-abdominal and pelvic swelling, mass and lump: Secondary | ICD-10-CM | POA: Diagnosis not present

## 2021-06-09 DIAGNOSIS — R109 Unspecified abdominal pain: Secondary | ICD-10-CM | POA: Insufficient documentation

## 2021-06-09 DIAGNOSIS — R1013 Epigastric pain: Secondary | ICD-10-CM | POA: Insufficient documentation

## 2021-06-09 DIAGNOSIS — I700 Atherosclerosis of aorta: Secondary | ICD-10-CM | POA: Diagnosis not present

## 2021-06-09 LAB — IRON AND TIBC
Iron: 47 ug/dL (ref 28–170)
Saturation Ratios: 15 % (ref 10.4–31.8)
TIBC: 308 ug/dL (ref 250–450)
UIBC: 261 ug/dL

## 2021-06-09 LAB — HEPATIC FUNCTION PANEL
ALT: 12 U/L (ref 0–44)
AST: 13 U/L — ABNORMAL LOW (ref 15–41)
Albumin: 4.1 g/dL (ref 3.5–5.0)
Alkaline Phosphatase: 58 U/L (ref 38–126)
Bilirubin, Direct: 0.1 mg/dL (ref 0.0–0.2)
Indirect Bilirubin: 0.5 mg/dL (ref 0.3–0.9)
Total Bilirubin: 0.6 mg/dL (ref 0.3–1.2)
Total Protein: 8 g/dL (ref 6.5–8.1)

## 2021-06-09 LAB — LIPASE, BLOOD: Lipase: 28 U/L (ref 11–51)

## 2021-06-09 LAB — FERRITIN: Ferritin: 55 ng/mL (ref 11–307)

## 2021-06-10 ENCOUNTER — Telehealth: Payer: Self-pay

## 2021-06-10 ENCOUNTER — Other Ambulatory Visit: Payer: Self-pay

## 2021-06-10 DIAGNOSIS — N838 Other noninflammatory disorders of ovary, fallopian tube and broad ligament: Secondary | ICD-10-CM

## 2021-06-10 NOTE — Telephone Encounter (Signed)
Diane from [facility] phoned to advise of pt's CT scan from yesterday.

## 2021-06-11 ENCOUNTER — Ambulatory Visit (HOSPITAL_COMMUNITY)
Admission: RE | Admit: 2021-06-11 | Discharge: 2021-06-11 | Disposition: A | Discharge status: Home / Self Care | Payer: Medicare Other | Source: Ambulatory Visit | Attending: Gastroenterology | Admitting: Gastroenterology

## 2021-06-11 ENCOUNTER — Other Ambulatory Visit: Payer: Self-pay

## 2021-06-11 ENCOUNTER — Other Ambulatory Visit: Payer: Self-pay | Admitting: Gastroenterology

## 2021-06-11 DIAGNOSIS — N838 Other noninflammatory disorders of ovary, fallopian tube and broad ligament: Secondary | ICD-10-CM | POA: Diagnosis not present

## 2021-06-12 NOTE — Progress Notes (Signed)
I have reviewed. See separate recommendations for nodular liver and upper abdominal pain under lab result notes.

## 2021-06-15 ENCOUNTER — Other Ambulatory Visit: Payer: Self-pay | Admitting: *Deleted

## 2021-06-15 DIAGNOSIS — R9389 Abnormal findings on diagnostic imaging of other specified body structures: Secondary | ICD-10-CM

## 2021-06-16 ENCOUNTER — Encounter: Payer: Self-pay | Admitting: Internal Medicine

## 2021-06-22 ENCOUNTER — Other Ambulatory Visit: Payer: Self-pay

## 2021-06-22 ENCOUNTER — Ambulatory Visit (HOSPITAL_COMMUNITY)
Admission: RE | Admit: 2021-06-22 | Discharge: 2021-06-22 | Disposition: A | Discharge status: Home / Self Care | Payer: Medicare Other | Source: Ambulatory Visit | Attending: Gastroenterology | Admitting: Gastroenterology

## 2021-06-22 DIAGNOSIS — R9389 Abnormal findings on diagnostic imaging of other specified body structures: Secondary | ICD-10-CM | POA: Diagnosis not present

## 2021-06-22 DIAGNOSIS — K7581 Nonalcoholic steatohepatitis (NASH): Secondary | ICD-10-CM | POA: Diagnosis not present

## 2021-06-29 ENCOUNTER — Encounter: Payer: Self-pay | Admitting: Adult Health

## 2021-06-29 ENCOUNTER — Ambulatory Visit: Payer: Medicare Other | Admitting: Adult Health

## 2021-06-29 ENCOUNTER — Other Ambulatory Visit: Payer: Self-pay

## 2021-06-29 VITALS — BP 157/75 | HR 82 | Ht 64.0 in | Wt 127.0 lb

## 2021-06-29 DIAGNOSIS — N950 Postmenopausal bleeding: Secondary | ICD-10-CM | POA: Diagnosis not present

## 2021-06-29 DIAGNOSIS — N952 Postmenopausal atrophic vaginitis: Secondary | ICD-10-CM

## 2021-06-29 DIAGNOSIS — N83202 Unspecified ovarian cyst, left side: Secondary | ICD-10-CM | POA: Diagnosis not present

## 2021-06-29 DIAGNOSIS — N83201 Unspecified ovarian cyst, right side: Secondary | ICD-10-CM

## 2021-06-29 NOTE — Progress Notes (Addendum)
  Subjective:     Patient ID: Sydney Jones, female   DOB: Jul 29, 1946, 75 y.o.   MRN: [id number]  HPI Sydney Jones is a 75 year old white female, married, PM in for PMB, almost daily for 75 years, had [place] 06/11/21. Recently diagnosed with mild cirrhosis.  PCP is Dr Donette Larry.  Review of Systems Has had vaginal bleeding, not heavy almost daily for 75 years, stopped about 2 months ago Not sexually active, has pain if tries,none in 20 years Reviewed past medical,surgical, social and family history. Reviewed medications and allergies.     Objective:   Physical Exam BP (!) 157/75 (BP Location: Left Arm, Patient Position: Sitting, Cuff Size: Normal)   Pulse 82   Ht 5\' 4"  (1.626 m)   Wt 127 lb (57.6 kg)   BMI 21.80 kg/m  Skin warm and dry.Pelvic: external genitalia is normal in appearance no lesions, small Pederson used.vagina: +atrophy, pale,urethra has no lesions or masses noted, cervix:smooth,pale, uterus: normal size, shape and contour, non tender, no masses felt, adnexa: no masses or tenderness noted. Bladder is non tender and no masses felt.  reviewed with her: IMPRESSION: BILATERAL ovarian cysts up to 4.3 cm diameter; Recommend followup US in 3-6 months. Note: This recommendation does not apply to premenarchal patients or to those with increased risk (genetic, family history, elevated tumor markers or other high-risk factors) of ovarian cancer. Reference: Radiology 2019 Nov; 293(2):359-371.   Suboptimal visualization of uterus with nonvisualization of endometrial complex.  AA is o Fall risk is low Depression screen PHQ 2/9 06/29/2021  Decreased Interest 0  Down, Depressed, Hopeless 0  PHQ - 2 Score 0  Altered sleeping 0  Tired, decreased energy 0  Change in appetite 0  Feeling bad or failure about yourself  0  Trouble concentrating 0  Moving slowly or fidgety/restless 0  Suicidal thoughts 0  PHQ-9 Score 0   GAD 7 : Generalized Anxiety Score 06/29/2021  Nervous, Anxious, on Edge  0  Control/stop worrying 0  Worry too much - different things 0  Trouble relaxing 0  Restless 0  Easily annoyed or irritable 0  Afraid - awful might happen 0  Total GAD 7 Score 0      Upstream - 06/29/21 1009       Pregnancy Intention Screening   Does the patient want to become pregnant in the next year? N/A    Does the patient's partner want to become pregnant in the next year? N/A    Would the patient like to discuss contraceptive options today? N/A               Examination chaperoned by 08/29/21 LPN Assessment:     1. PMB (postmenopausal bleeding) Return 8/18 with Dr 9/18 possible endometrial biopsy, may need to have hysteroscopy D&C, has low pain tolerance   2. Bilateral ovarian cysts Recheck cyst with Charlotta Newton in 3 months    3. Vaginal atrophy  Plan:     Return to see Dr [place] 07/16/21 fir possible endometrial biopsy

## 2021-07-11 NOTE — Progress Notes (Signed)
   GYN VISIT Patient name: SWETHA RAYLE MRN [id number]  Date of birth: 10-22-1946 Chief Complaint:   Procedure (Discuss endo bx, pt states she will go to the OR for procedure)  History of Present Illness:   AMEE BOOTHE is a 75 y.o. G1P1001 PM female being seen today for the following concerns:  PMB: She reports that about 33yrs ago noted that she had daily bleeding, one pantyliner would last for about 2 days.  Bleeding was dark- appeared like dark blood.  No bleeding when she wiped.  Bleeding stopped about 4 mos ago- nothing since that time.  Denies vaginal discharge, itching or odor.  Not sexually active. Denies pelvic or abdominal pain.  Imaging on 06/11/21: 5.8cm uterus, unable to visualize endometrium. Left ovary: Large simple cyst LEFT ovary 4.2 x 4.3 x 4.3 cm; Recommend followup US in 3-6 months Right ovary within normal limits  No LMP recorded. Patient is postmenopausal.  Depression screen [facility] 2/9 06/29/2021  Decreased Interest 0  Down, Depressed, Hopeless 0  PHQ - 2 Score 0  Altered sleeping 0  Tired, decreased energy 0  Change in appetite 0  Feeling bad or failure about yourself  0  Trouble concentrating 0  Moving slowly or fidgety/restless 0  Suicidal thoughts 0  PHQ-9 Score 0     Review of Systems:   Pertinent items are noted in HPI Denies fever/chills, dizziness, headaches, visual disturbances, fatigue, shortness of breath, chest pain, abdominal pain, vomiting, bowel movements, urination, or intercourse unless otherwise stated above.  Pertinent History Reviewed:  Reviewed past medical,surgical, social, obstetrical and family history.  Reviewed problem list, medications and allergies. Physical Assessment:   Vitals:   07/16/21 1036  BP: (!) 161/71  Pulse: 75  Weight: 127 lb (57.6 kg)  Height: 5\' 4"  (1.626 m)  Body mass index is 21.8 kg/m.       Physical Examination:   General appearance: alert, well appearing, and in no distress  Psych: mood  appropriate, normal affect  Skin: warm & dry   Cardiovascular: normal heart rate noted  Respiratory: normal respiratory effort, no distress  Abdomen: soft, non-tender   Pelvic: normal external genitalia, vulva, vagina, cervix, uterus and adnexa, declined today  Extremities: no edema   Chaperone: N/A    Assessment & Plan:  1) Postmenopausal bleeding -Reviewed findings and discussed plan for evaluation of endometrium due to r/o concerning abnormality such as endometrial cancer -declined EMB in-office today -offered alternative of pre-meds (Valium, cytotec)- pt declined -Discussed hysteroscopy, D&C and possible intervention- reviewed risk/benefit including but not limited to risk of bleeding, infection and potential for uterine perforation.  Questions and concerns were addressed.  Due to financial restrictions she will need to hold off at least for a few mos -advise pt to proceed as soon as possible- likely in Oct. Referral created -Pt also had questions regarding next step should endometrial cancer be present  Majority of today's visit was spent in face to face consultation- Nov.   Return in about 2 months (around 09/15/2021) for 1-31mos with Dr. 0mo for preop appt (plan for surgery 10/18)-ok for televisit.   Charlotta Newton, DO Attending Obstetrician & Gynecologist, [facility] for [facility]

## 2021-07-16 ENCOUNTER — Ambulatory Visit: Payer: Medicare Other | Admitting: Obstetrics & Gynecology

## 2021-07-16 ENCOUNTER — Encounter: Payer: Self-pay | Admitting: Obstetrics & Gynecology

## 2021-07-16 ENCOUNTER — Other Ambulatory Visit: Payer: Self-pay

## 2021-07-16 VITALS — BP 161/71 | HR 75 | Ht 64.0 in | Wt 127.0 lb

## 2021-07-16 DIAGNOSIS — N950 Postmenopausal bleeding: Secondary | ICD-10-CM | POA: Diagnosis not present

## 2021-07-17 DIAGNOSIS — I10 Essential (primary) hypertension: Secondary | ICD-10-CM | POA: Diagnosis not present

## 2021-07-17 DIAGNOSIS — J439 Emphysema, unspecified: Secondary | ICD-10-CM | POA: Diagnosis not present

## 2021-07-17 DIAGNOSIS — E7800 Pure hypercholesterolemia, unspecified: Secondary | ICD-10-CM | POA: Diagnosis not present

## 2021-07-17 DIAGNOSIS — E039 Hypothyroidism, unspecified: Secondary | ICD-10-CM | POA: Diagnosis not present

## 2021-07-17 DIAGNOSIS — K219 Gastro-esophageal reflux disease without esophagitis: Secondary | ICD-10-CM | POA: Diagnosis not present

## 2021-08-14 ENCOUNTER — Telehealth: Payer: Medicare Other | Admitting: Obstetrics & Gynecology

## 2021-08-20 ENCOUNTER — Ambulatory Visit: Payer: Medicare Other | Admitting: Gastroenterology

## 2021-08-20 IMAGING — CT CT ABD-PELV W/O CM
2 of 4 series · 16 of 46 positions shown, 18 images · non-contrast
Comparison: None.

CLINICAL DATA: Elevated lipase with abdominal pain.

EXAM:
CT ABDOMEN AND PELVIS WITHOUT CONTRAST
TECHNIQUE: Multidetector CT imaging of the abdomen and pelvis was performed
following the standard protocol without IV contrast.

[Series 2: axial st · axial · 0.79mm/px · z∈[+976,+1351]mm · 13 of 85 slices shown, 15 images]
[im 5/85  soft-tissue]
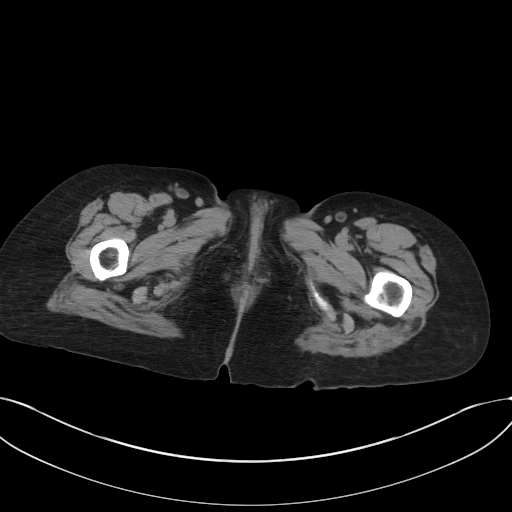
[im 5/85  bone]
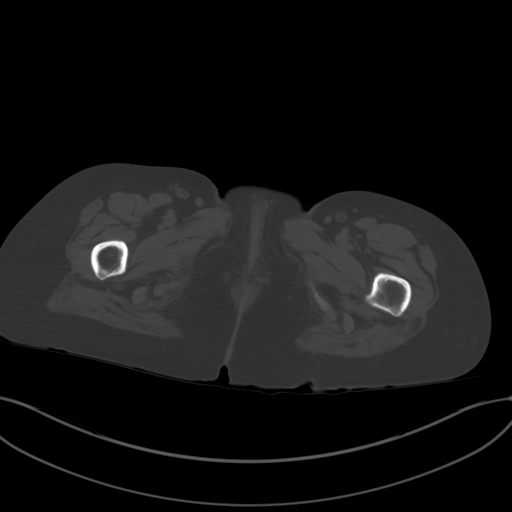
[im 10/85  soft-tissue]
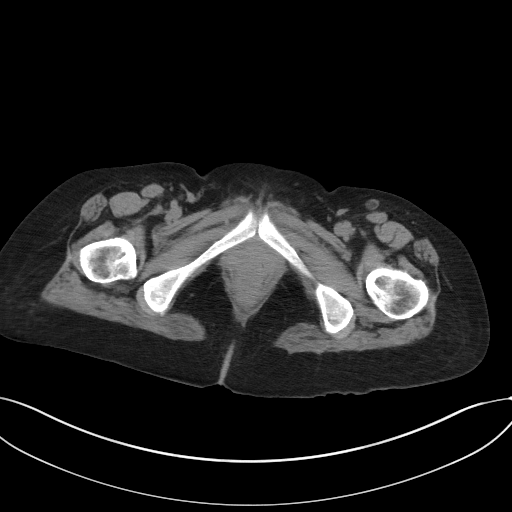
[im 19/85  soft-tissue]
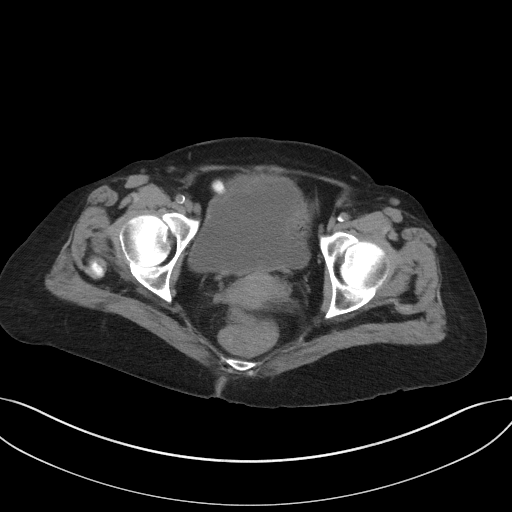
[im 24/85  soft-tissue]
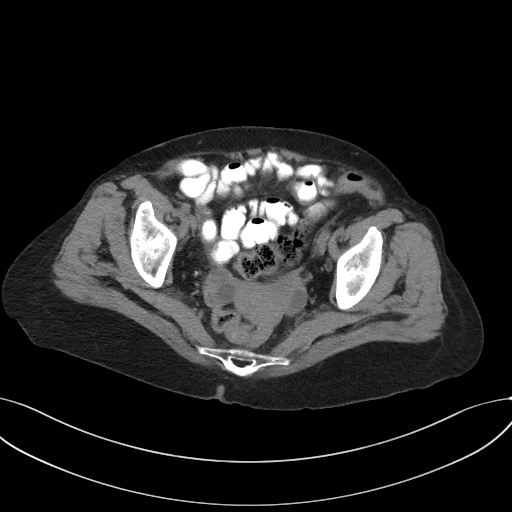
[im 29/85  soft-tissue]
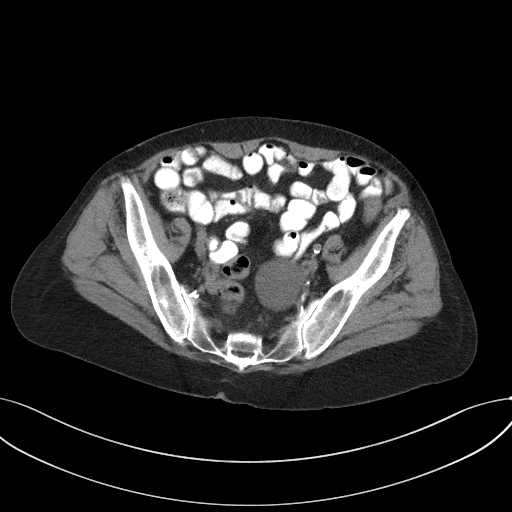
[im 38/85  soft-tissue]
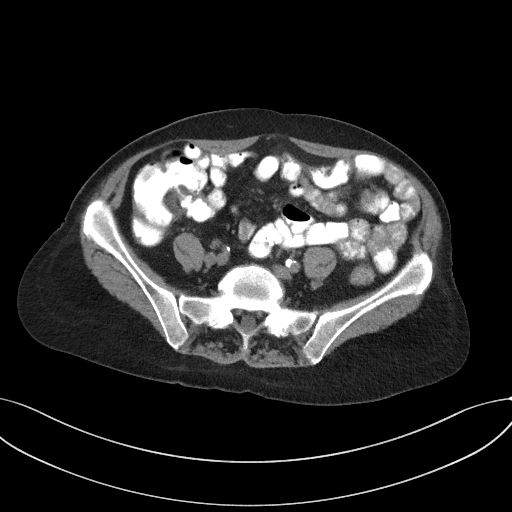
[im 43/85  soft-tissue]
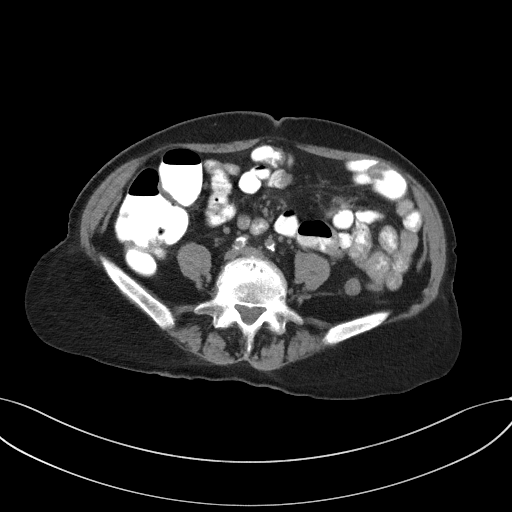
[im 47/85  soft-tissue]
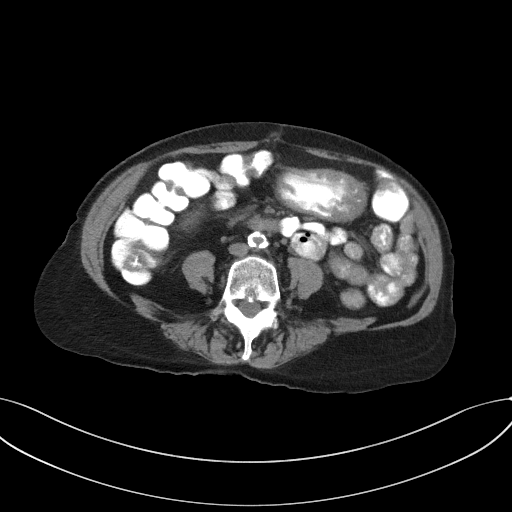
[im 57/85  soft-tissue]
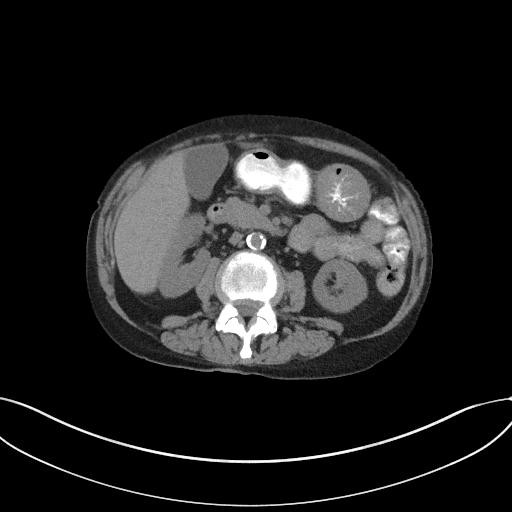
[im 57/85  bone]
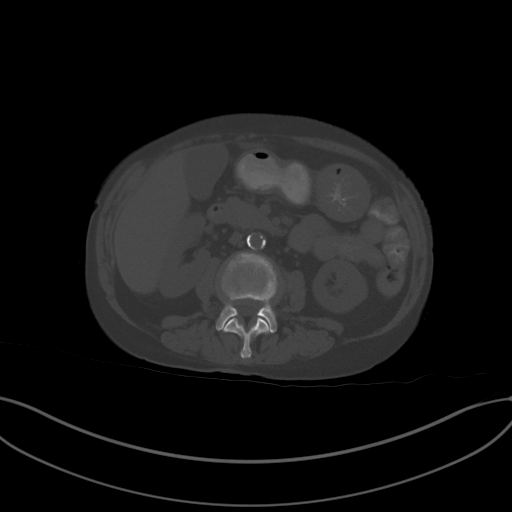
[im 61/85  soft-tissue]
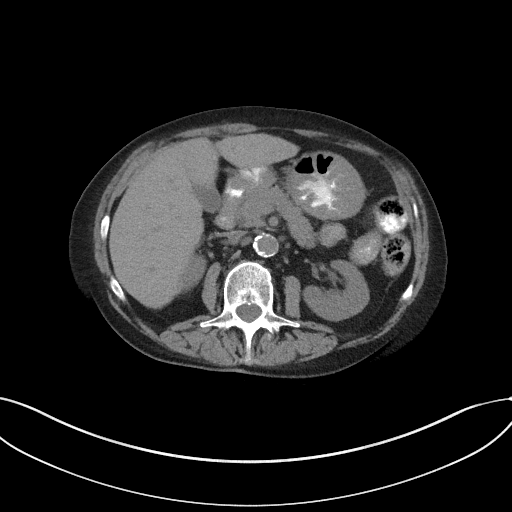
[im 66/85  soft-tissue]
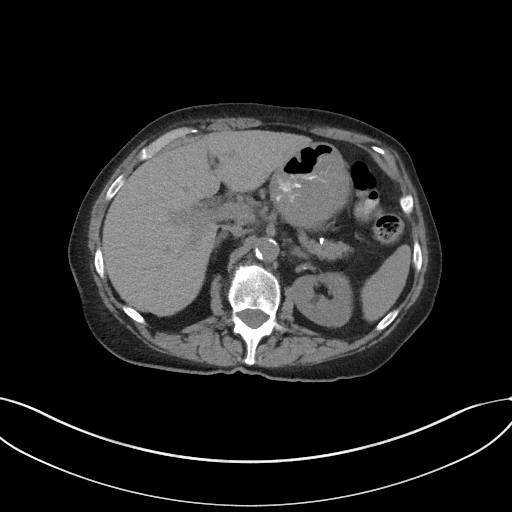
[im 75/85  soft-tissue]
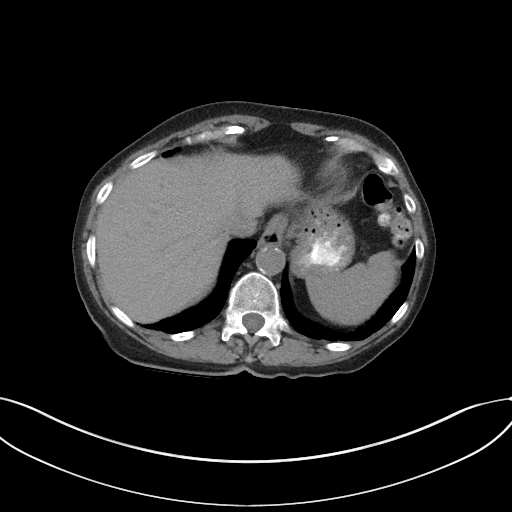
[im 80/85  soft-tissue]
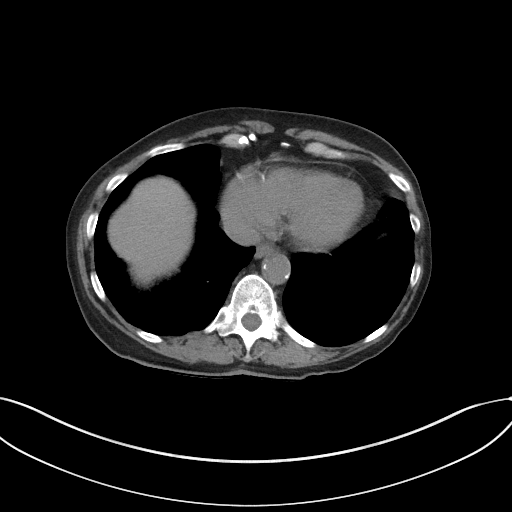

[Series 5: coronal st · coronal · 0.76mm/px · 3 of 90 slices shown]
[im 30/90  soft-tissue]
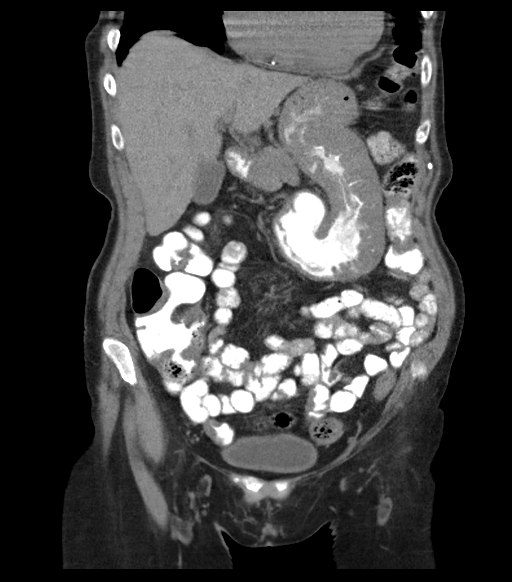
[im 40/90  soft-tissue]
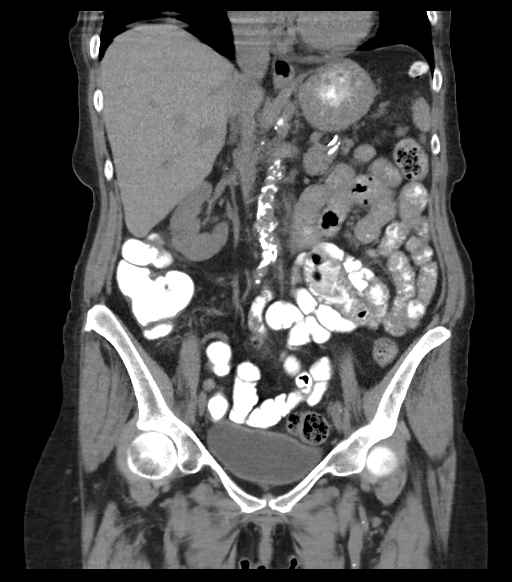
[im 50/90  soft-tissue]
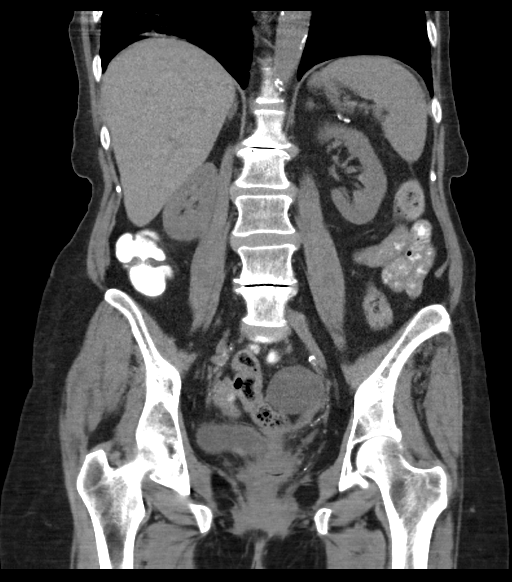

[16 of 46 positions shown; findings below may reference images not displayed]

FINDINGS: Lower chest: Chronic interstitial changes noted in the lung bases.

Hepatobiliary: Nodular liver contour raises the question of
cirrhosis (lateral segment left liver on [DATE]). No focal abnormality
in the liver on this study without intravenous contrast. There is no
evidence for gallstones, gallbladder wall thickening, or
pericholecystic fluid. No intrahepatic or extrahepatic biliary
dilation.

Pancreas: No focal mass lesion. No dilatation of the main duct. No
intraparenchymal cyst. No peripancreatic edema.

Spleen: No splenomegaly. No focal mass lesion.

Adrenals/Urinary Tract: Mild bilateral adrenal thickening without
nodule or mass. Unremarkable noncontrast appearance of the kidneys.
No evidence for hydroureter. The urinary bladder appears normal for
the degree of distention.

Stomach/Bowel: Stomach is unremarkable. No gastric wall thickening.
No evidence of outlet obstruction. Duodenum is normally positioned
as is the ligament of Treitz. Duodenal diverticulum noted. No small
bowel wall thickening. The terminal ileum is normal. The appendix is
not well visualized, but there is no edema or inflammation in the
region of the cecum. No small bowel dilatation. No gross colonic
mass. No colonic wall thickening.

Vascular/Lymphatic: There is advanced atherosclerotic calcification
of the abdominal aorta without aneurysm. There is no gastrohepatic
or hepatoduodenal ligament lymphadenopathy. No retroperitoneal or
mesenteric lymphadenopathy. No pelvic sidewall lymphadenopathy.

Reproductive: Uterus visualized in situ. Cystic mass left adnexal
space measures 4.8 cm with apparent lateral mural nodule on 60/2.
Cystic and solid change noted right ovary measuring 2.5 x 3.2 cm on
61/2.

Other: No intraperitoneal free fluid.

Musculoskeletal: No worrisome lytic or sclerotic osseous
abnormality.
IMPRESSION: 1. Nodular liver contour raises the question of cirrhosis.
2. 4.8 cm cystic mass left adnexal space with apparent lateral mural
nodule. Immediate follow-up pelvic ultrasound recommended to further
evaluate as neoplasm a concern. No evidence for ascites.
3. 2.5 x 3.2 cm cystic and solid change right ovary. This could also
be further assessed at the time of follow-up ultrasound.
4. Aortic Atherosclerosis ([id number]).

These results will be called to the ordering clinician or
representative by the Radiologist Assistant, and communication
documented in the PACS or [REDACTED].

## 2021-09-04 NOTE — H&P (Signed)
GYN H&P  Sydney Jones is a 75 y.o. G1P1001 postmenopausal female who presents for hysteroscopy, D&C with possible intervention due to thickened endometrium.  In review, she reported that for several years she had daily bleeding, one pantyliner would last for about 2 days.  Bleeding was dark- appeared like dark blood.  No bleeding when she wiped.  Bleeding stopped about 4 mos ago- nothing since that time.  Denies vaginal discharge, itching or odor.  Not sexually active. Denies pelvic or abdominal pain.    Denies fever, chills, sweats, dysuria, nausea, vomiting, other GI or GU symptoms or other general symptoms.   Imaging on 06/11/21: 5.8cm uterus, unable to visualize endometrium. Left ovary: Large simple cyst LEFT ovary 4.2 x 4.3 x 4.3 cm; Recommend followup US in 3-6 months Right ovary within normal limits   Past Medical History:  Diagnosis Date   Acid reflux    Anxiety    Hypertension    Thyroid disease    Hypothyroid   Past Surgical History:  Procedure Laterality Date   BIOPSY  05/02/2020   Procedure: BIOPSY;  Surgeon: Corbin Ade, MD;  Location: [facility];  Service: Endoscopy;;  gastric   CATARACT EXTRACTION     ESOPHAGOGASTRODUODENOSCOPY N/A 05/02/2020   Procedure: ESOPHAGOGASTRODUODENOSCOPY (EGD);  Surgeon: Corbin Ade, MD;  Mild erosive reflux esophagitis, benign-appearing gastric polyps but otherwise normal-appearing stomach s/p polypectomy and gastric mucosal biopsy, patent pylorus, normal examined duodenum.  Pathology with fundic gland polyp, mild chronic gastritis, negative for H. pylori.     LAPAROSCOPY  1980s   for endometriosis    perdontal     POLYPECTOMY  05/02/2020   Procedure: POLYPECTOMY;  Surgeon: Corbin Ade, MD;  Location: [facility];  Service: Endoscopy;;  gastric   WISDOM TOOTH EXTRACTION     OB History  Gravida Para Term Preterm AB Living  1 1 1     1   SAB IAB Ectopic Multiple Live Births               # Outcome Date GA Lbr Len/2nd  Weight Sex Delivery Anes PTL Lv  1 Term           Patient denies any other pertinent gynecologic issues.  No current facility-administered medications on file prior to encounter.   Current Outpatient Medications on File Prior to Encounter  Medication Sig Dispense Refill   acetaminophen (TYLENOL) 500 MG tablet Take 1,000 mg by mouth as needed for moderate pain or headache.     amLODipine (NORVASC) 5 MG tablet Take 5 mg by mouth 2 (two) times daily.     docusate sodium (COLACE) 100 MG capsule Take 100 mg by mouth daily.     doxazosin (CARDURA) 2 MG tablet Take 2 mg by mouth at bedtime.     Ferrous Sulfate (IRON) 325 (65 Fe) MG TABS Take by mouth every other day.     levothyroxine (SYNTHROID, LEVOTHROID) 25 MCG tablet Take 25 mcg by mouth every morning.  6   lisinopril (PRINIVIL,ZESTRIL) 40 MG tablet Take 40 mg by mouth every morning.  2   loratadine (CLARITIN) 10 MG tablet Take 10 mg by mouth every morning.     pantoprazole (PROTONIX) 40 MG tablet Take 1 tablet by mouth twice daily 60 tablet 5   Allergies  Allergen Reactions   Codeine Nausea Only   Tetracyclines & Related Other (See Comments)    Throat and stomach irritation (burning sensation)    Social History:  reports that she has been smoking cigarettes. She has been smoking an average of 1 pack per day. She has never used smokeless tobacco. She reports that she does not currently use alcohol. She reports that she does not use drugs. Family History  Problem Relation Age of Onset   Diabetes Father    Hypertension Father    Stroke Father    Emphysema Mother    Colon cancer Neg Hx    Stomach cancer Neg Hx    Esophageal cancer Neg Hx    Pancreatic cancer Neg Hx    Breast cancer Neg Hx     Review of Systems: Pertinent items noted in HPI and remainder of comprehensive ROS otherwise negative.  PHYSICAL EXAM: Examination completed in office CONSTITUTIONAL: Well-developed, well-nourished female in no acute distress.  NECK:  Normal range of motion, supple, no masses SKIN: Skin is warm and dry. No rash noted. Not diaphoretic. No erythema. No pallor. NEUROLOGIC: Alert and oriented to person, place, and time.  No cranial nerve deficit noted. PSYCHIATRIC: Normal mood and affect. Normal behavior. Normal judgment and thought content. CARDIOVASCULAR: Normal heart rate noted, regular rhythm RESPIRATORY: Effort and breath sounds normal, no problems with respiration noted ABDOMEN: Soft, nontender, nondistended. PELVIC: deferred MUSCULOSKELETAL: Normal range of motion. No tenderness.  No cyanosis or edema.  Labs: To be obtained  Assessment: Postmenopausal bleeding Thickened endometrium   Plan: -NPO -LR @ 125cc/hr -SCDs to OR -Risk/benefit and alternatives reviewed with patient.  Question and concerns were addressed and she desires to proceed.  Myna Hidalgo, DO Attending Obstetrician & Gynecologist, [facility] for [employer], [facility]

## 2021-09-11 ENCOUNTER — Other Ambulatory Visit (HOSPITAL_COMMUNITY): Payer: Medicare Other

## 2021-09-15 ENCOUNTER — Ambulatory Visit: Admit: 2021-09-15 | Payer: Medicare Other | Admitting: Obstetrics & Gynecology

## 2021-09-15 SURGERY — DILATATION & CURETTAGE/HYSTEROSCOPY WITH MYOSURE
Anesthesia: General

## 2021-09-23 ENCOUNTER — Encounter: Payer: Medicare Other | Admitting: Obstetrics & Gynecology

## 2021-09-28 DIAGNOSIS — K219 Gastro-esophageal reflux disease without esophagitis: Secondary | ICD-10-CM | POA: Diagnosis not present

## 2021-09-28 DIAGNOSIS — E871 Hypo-osmolality and hyponatremia: Secondary | ICD-10-CM | POA: Diagnosis not present

## 2021-09-28 DIAGNOSIS — J439 Emphysema, unspecified: Secondary | ICD-10-CM | POA: Diagnosis not present

## 2021-09-28 DIAGNOSIS — E039 Hypothyroidism, unspecified: Secondary | ICD-10-CM | POA: Diagnosis not present

## 2021-09-28 DIAGNOSIS — E7800 Pure hypercholesterolemia, unspecified: Secondary | ICD-10-CM | POA: Diagnosis not present

## 2021-09-28 DIAGNOSIS — R7303 Prediabetes: Secondary | ICD-10-CM | POA: Diagnosis not present

## 2021-09-28 DIAGNOSIS — Z1389 Encounter for screening for other disorder: Secondary | ICD-10-CM | POA: Diagnosis not present

## 2021-09-28 DIAGNOSIS — Z23 Encounter for immunization: Secondary | ICD-10-CM | POA: Diagnosis not present

## 2021-09-28 DIAGNOSIS — Z0000 Encounter for general adult medical examination without abnormal findings: Secondary | ICD-10-CM | POA: Diagnosis not present

## 2021-09-28 DIAGNOSIS — I10 Essential (primary) hypertension: Secondary | ICD-10-CM | POA: Diagnosis not present

## 2021-09-28 DIAGNOSIS — F17210 Nicotine dependence, cigarettes, uncomplicated: Secondary | ICD-10-CM | POA: Diagnosis not present

## 2021-10-07 ENCOUNTER — Ambulatory Visit: Payer: Medicare Other | Admitting: Gastroenterology

## 2021-10-07 ENCOUNTER — Encounter: Payer: Self-pay | Admitting: Gastroenterology

## 2021-10-07 ENCOUNTER — Other Ambulatory Visit: Payer: Self-pay

## 2021-10-07 VITALS — BP 137/67 | HR 77 | Temp 96.8°F | Ht 64.0 in | Wt 128.4 lb

## 2021-10-07 DIAGNOSIS — D509 Iron deficiency anemia, unspecified: Secondary | ICD-10-CM | POA: Diagnosis not present

## 2021-10-07 DIAGNOSIS — K7460 Unspecified cirrhosis of liver: Secondary | ICD-10-CM | POA: Insufficient documentation

## 2021-10-07 DIAGNOSIS — K2100 Gastro-esophageal reflux disease with esophagitis, without bleeding: Secondary | ICD-10-CM | POA: Diagnosis not present

## 2021-10-07 NOTE — Progress Notes (Signed)
Primary Care Physician: Wenda Low, MD  Primary Gastroenterologist:  Garfield Cornea, MD   Chief Complaint  Patient presents with   Gastroesophageal Reflux    Occ, according to what she eats     HPI: Sydney Jones is a 75 y.o. female here for follow-up.  Last seen in July 2022.  She has a history of GERD, mild constipation, IDA with EGD from June 2021 (erosive reflux esophagitis, fundic gland polyps, mild gastritis negative for H. pylori) and has declined colonoscopy in the past due to issues with her well water/drying up and limited water to flush toilets and take baths.  Heme positive stool in the past.  Called in June reporting abdominal pain.  Seen in the ED at [facility]  Ultimately left before all testing returned.  Lipase elevated at 93, LFTs normal, CBC normal.  Subsequent work-up included CT abdomen pelvis without contrast in July 2022, pelvic ultrasound, abdominal ultrasound with elastography as outlined below.  There is concern for nodular liver contour raising the question of cirrhosis noted on CT and ultrasound.  No splenomegaly.  kPA at 4.2.  Also noted to have cystic lesions in both ovaries and has seen gynecology recommending follow-up ultrasound as well as endometrial biopsy for postmenopausal vaginal bleeding.  Other alternative including hysteroscopy, D&C with possible intervention.  Patient states she has postponed procedure because of finances, need to pay off medical bills.  Her husband also passed away in August 25, 2023.  She is due for follow-up with her gynecologist and plans to make appointment in the near future.  She continues to have scant vaginal bleeding every few days.  Only rare heartburn symptoms in setting of pantoprazole BID. No abdominal pain since the summer. Only had two episodes. BM about every other day. No melena, brbpr.  No unintentional weight loss.    CT abdomen pelvis without contrast July 2022: IMPRESSION: 1. Nodular liver contour  raises the question of cirrhosis. 2. 4.8 cm cystic mass left adnexal space with apparent lateral mural nodule. Immediate follow-up pelvic ultrasound recommended to further evaluate as neoplasm a concern. No evidence for ascites. 3. 2.5 x 3.2 cm cystic and solid change right ovary. This could also be further assessed at the time of follow-up ultrasound. 4. Aortic Atherosclerosis (ICD10-I70.0).  Pelvic ultrasound July 2022: IMPRESSION: BILATERAL ovarian cysts up to 4.3 cm diameter; Recommend followup US in 3-6 months. Note: This recommendation does not apply to premenarchal patients or to those with increased risk (genetic, family history, elevated tumor markers or other high-risk factors) of ovarian cancer. Reference: Radiology 2019 Nov; 293(2):359-371.   Suboptimal visualization of uterus with nonvisualization of endometrial complex.  Abdominal ultrasound with elastography July 2022: IMPRESSION: ULTRASOUND RUQ:   Coarsened hepatic echotexture with nodular hepatic contour, suggestive of hepatic cirrhosis. No overt hepatic lesion visualized.   ULTRASOUND HEPATIC ELASTOGRAPHY:   Median kPa:  4.2   Diagnostic category:  < or = 5 kPa: high probability of being normal   Current Outpatient Medications  Medication Sig Dispense Refill   acetaminophen (TYLENOL) 500 MG tablet Take 1,000 mg by mouth as needed for moderate pain or headache.     amLODipine (NORVASC) 5 MG tablet Take 5 mg by mouth 2 (two) times daily.     docusate sodium (COLACE) 100 MG capsule Take 100 mg by mouth daily.     doxazosin (CARDURA) 2 MG tablet Take 2 mg by mouth at bedtime.     Ferrous Sulfate (IRON) 325 (65 Fe)  MG TABS Take by mouth every other day.     levothyroxine (SYNTHROID, LEVOTHROID) 25 MCG tablet Take 25 mcg by mouth every morning.  6   lisinopril (PRINIVIL,ZESTRIL) 40 MG tablet Take 40 mg by mouth every morning.  2   loratadine (CLARITIN) 10 MG tablet Take 10 mg by mouth every morning.      pantoprazole (PROTONIX) 40 MG tablet Take 1 tablet by mouth twice daily 60 tablet 5   No current facility-administered medications for this visit.    Allergies as of 10/07/2021 - Review Complete 10/07/2021  Allergen Reaction Noted   Codeine Nausea Only 06/21/2018   Tetracyclines & related Other (See Comments) 06/21/2018    ROS:  General: Negative for anorexia, weight loss, fever, chills, fatigue, weakness. ENT: Negative for hoarseness, difficulty swallowing , nasal congestion. CV: Negative for chest pain, angina, palpitations, dyspnea on exertion, peripheral edema.  Respiratory: Negative for dyspnea at rest, dyspnea on exertion, cough, sputum, wheezing.  GI: See history of present illness. GU:  Negative for dysuria, hematuria, urinary incontinence, urinary frequency, nocturnal urination.  See HPI Endo: Negative for unusual weight change.    Physical Examination:   BP 137/67   Pulse 77   Temp (!) 96.8 F (36 C) (Temporal)   Ht 5\' 4"  (1.626 m)   Wt 128 lb 6.4 oz (58.2 kg)   BMI 22.04 kg/m   General: Well-nourished, well-developed in no acute distress.  Eyes: No icterus. Mouth: masked Lungs: Clear to auscultation bilaterally.  Heart: Regular rate and rhythm, no murmurs rubs or gallops.  Abdomen: Bowel sounds are normal, nontender, nondistended, no hepatosplenomegaly or masses, no abdominal bruits or hernia , no rebound or guarding.   Extremities: No lower extremity edema. No clubbing or deformities. Neuro: Alert and oriented x 4   Skin: Warm and dry, no jaundice.   Psych: Alert and cooperative, normal mood and affect.  Labs:  Lab Results  Component Value Date   CREATININE 0.60 06/21/2018   BUN 8 06/21/2018   NA 132 (L) 06/21/2018   K 3.9 06/21/2018   CL 95 (L) 06/21/2018   CO2 27 06/21/2018   Lab Results  Component Value Date   IRON 47 06/09/2021   TIBC 308 06/09/2021   FERRITIN 55 06/09/2021   Lab Results  Component Value Date   ALT 12 06/09/2021   AST 13  (L) 06/09/2021   ALKPHOS 58 06/09/2021   BILITOT 0.6 06/09/2021   Lab Results  Component Value Date   LIPASE 28 06/09/2021   Labs from June 2022: Hemoglobin 13.4, hematocrit 38.9, MCV 86.4.  Creatinine 0.64. LFTs normal.  Lipase 93.     Assessment:  GERD: History of reflux esophagitis on prior EGD.  Symptoms well controlled on current regimen.  History of IDA: History of heme positive stool.  No prior colonoscopy, patient continues to decline for now due to lack of adequate water at home as well as financial reasons.  Ferritin has normalized.  Ferritin of 09 Apr 2020, up to 55 now.  Hemoglobin 13.4 back in June.  Continues to have scant vaginal bleeding.  No melena or rectal bleeding.  Possible early cirrhosis: CT and ultrasound in July 2022 with subtle contour nodularity of the liver but with normal kPA on elastography.  Discussed cirrhosis management with patient.  In the future we will check hepatitis A and B immunity and vaccinate as needed.  We will also screen for hepatitis B and C.  We will need to check labs every  6 months for hepatic function and consider ultrasound every 6 months for screening for hepatoma.  Right now financially, patient is not able to complete these test.  GYN work-up takes precedence.  We will see her back in 6 months and go from there.  Plan: Obtain recent labs from PCP for review. Continue pantoprazole 40 mg once to twice daily to control reflux symptoms. Encouraged her to follow-up with gynecology for work-up of bilateral ovarian cyst and postmenopausal bleeding. Plan to see her back in 6 months for cirrhosis care and limited work-up (screen for viral hepatitis and check immunity status).  If patient decides to pursue colonoscopy for IDA, she will call let us know.

## 2021-10-07 NOTE — Patient Instructions (Addendum)
For anemia: your last Hemoglobin and iron levels were normal in June 2022. We will get copy of recent labs from PCP  for review. Continue oral iron every other day. For GERD: continue pantoprazole once to twice daily to control your symptoms.  For your liver: you may have early cirrhosis. We will need to monitor your liver function and for liver nodules twice yearly. I will get copy of recent labs from your PCP. We will consider next liver check after your six month follow up visit.  If you decide to pursue colonoscopy please let us know. Follow up with gynecology as planned for consideration of biopsy vs hysterectomy and follow up on ovarian cysts. Call if you have any questions or concerns. Please plan to come back and see [place] in six months.

## 2021-10-12 ENCOUNTER — Telehealth: Payer: Self-pay | Admitting: Gastroenterology

## 2021-10-12 DIAGNOSIS — J449 Chronic obstructive pulmonary disease, unspecified: Secondary | ICD-10-CM | POA: Diagnosis not present

## 2021-10-12 DIAGNOSIS — E7800 Pure hypercholesterolemia, unspecified: Secondary | ICD-10-CM | POA: Diagnosis not present

## 2021-10-12 DIAGNOSIS — E039 Hypothyroidism, unspecified: Secondary | ICD-10-CM | POA: Diagnosis not present

## 2021-10-12 DIAGNOSIS — I10 Essential (primary) hypertension: Secondary | ICD-10-CM | POA: Diagnosis not present

## 2021-10-12 DIAGNOSIS — J439 Emphysema, unspecified: Secondary | ICD-10-CM | POA: Diagnosis not present

## 2021-10-12 DIAGNOSIS — K219 Gastro-esophageal reflux disease without esophagitis: Secondary | ICD-10-CM | POA: Diagnosis not present

## 2021-10-12 NOTE — Telephone Encounter (Signed)
Labs dated 09/28/21:  A1C 5.7, White blood cell count 9700, hemoglobin 13.5, hematocrit 39.5, MCV 88, platelets 290,000, BUN 12, creatinine 0.68, total bilirubin 0.5, alkaline phosphatase 56, AST 12, ALT 12, albumin 4.2, TSH 5.06.   Return ov in six months as planned.

## 2021-11-02 DIAGNOSIS — Z961 Presence of intraocular lens: Secondary | ICD-10-CM | POA: Diagnosis not present

## 2021-11-02 DIAGNOSIS — R600 Localized edema: Secondary | ICD-10-CM | POA: Diagnosis not present

## 2021-11-25 ENCOUNTER — Other Ambulatory Visit: Payer: Self-pay | Admitting: Gastroenterology

## 2021-11-25 DIAGNOSIS — K2100 Gastro-esophageal reflux disease with esophagitis, without bleeding: Secondary | ICD-10-CM

## 2021-12-09 DIAGNOSIS — I10 Essential (primary) hypertension: Secondary | ICD-10-CM | POA: Diagnosis not present

## 2021-12-09 DIAGNOSIS — K219 Gastro-esophageal reflux disease without esophagitis: Secondary | ICD-10-CM | POA: Diagnosis not present

## 2021-12-09 DIAGNOSIS — J449 Chronic obstructive pulmonary disease, unspecified: Secondary | ICD-10-CM | POA: Diagnosis not present

## 2021-12-09 DIAGNOSIS — J439 Emphysema, unspecified: Secondary | ICD-10-CM | POA: Diagnosis not present

## 2021-12-09 DIAGNOSIS — E7800 Pure hypercholesterolemia, unspecified: Secondary | ICD-10-CM | POA: Diagnosis not present

## 2021-12-09 DIAGNOSIS — E039 Hypothyroidism, unspecified: Secondary | ICD-10-CM | POA: Diagnosis not present

## 2022-03-22 ENCOUNTER — Ambulatory Visit
Admission: RE | Admit: 2022-03-22 | Discharge: 2022-03-22 | Disposition: A | Discharge status: Home / Self Care | Payer: Medicare Other | Source: Ambulatory Visit | Attending: Internal Medicine | Admitting: Internal Medicine

## 2022-03-22 ENCOUNTER — Other Ambulatory Visit: Payer: Self-pay | Admitting: Internal Medicine

## 2022-03-22 DIAGNOSIS — I10 Essential (primary) hypertension: Secondary | ICD-10-CM | POA: Diagnosis not present

## 2022-03-22 DIAGNOSIS — R7303 Prediabetes: Secondary | ICD-10-CM | POA: Diagnosis not present

## 2022-03-22 DIAGNOSIS — E7800 Pure hypercholesterolemia, unspecified: Secondary | ICD-10-CM | POA: Diagnosis not present

## 2022-03-22 DIAGNOSIS — E871 Hypo-osmolality and hyponatremia: Secondary | ICD-10-CM | POA: Diagnosis not present

## 2022-03-22 DIAGNOSIS — K7460 Unspecified cirrhosis of liver: Secondary | ICD-10-CM | POA: Diagnosis not present

## 2022-03-22 DIAGNOSIS — M25561 Pain in right knee: Secondary | ICD-10-CM

## 2022-03-22 DIAGNOSIS — J439 Emphysema, unspecified: Secondary | ICD-10-CM | POA: Diagnosis not present

## 2022-03-22 DIAGNOSIS — K219 Gastro-esophageal reflux disease without esophagitis: Secondary | ICD-10-CM | POA: Diagnosis not present

## 2022-03-22 DIAGNOSIS — E039 Hypothyroidism, unspecified: Secondary | ICD-10-CM | POA: Diagnosis not present

## 2022-03-22 DIAGNOSIS — M25569 Pain in unspecified knee: Secondary | ICD-10-CM | POA: Diagnosis not present

## 2022-03-22 DIAGNOSIS — I700 Atherosclerosis of aorta: Secondary | ICD-10-CM | POA: Diagnosis not present

## 2022-03-22 DIAGNOSIS — F17210 Nicotine dependence, cigarettes, uncomplicated: Secondary | ICD-10-CM | POA: Diagnosis not present

## 2022-03-30 ENCOUNTER — Encounter: Payer: Self-pay | Admitting: Internal Medicine

## 2022-04-19 ENCOUNTER — Telehealth: Payer: Medicare Other | Admitting: Gastroenterology

## 2022-05-02 NOTE — Progress Notes (Unsigned)
GI Office Note    Referring Provider: Georgann Housekeeper, MD Primary Care Physician:  Georgann Housekeeper, MD Primary GI: Dr. Jena Gauss  Date:  05/03/2022  ID:  Sydney Jones, DOB 05-Dec-1945, MRN [id number]   Chief Complaint   Chief Complaint  Patient presents with   Follow-up     History of Present Illness  Sydney Jones is a 76 y.o. female presenting today for follow up. History of mild constipation, GERD, IDA with last EGD June 2021 (erosive reflux esophagitis, fundic gland polyps, mild gastritis).   Last seen in clinic 10/07/21. She had previously been seen in the ED at [facility] for abdominal pain with labs revealing elevated Lipase 93, normal LFTs, normal CBC. She had subsequent evaluation with CT A/P with contrast, pelvic US, and abdominal US with elastography in July 2022. Abdominal US with nodular liver contour with concern of cirrhosis noted on CT as well. kPA 4.2. She had lesions on both ovaries was to follow up with gynecology for vaginal bleeding and need for endometrial biopsy. She had financial difficulties and lost her husband in Sept 2022. Declined colonoscopy due to financial reasons. She continued to have scant vaginal bleeding, rare heartburn symptoms on pantoprazole BID. BM every other day. Denied melena, BRBPR, unintentional weight loss.   No documented history of colonoscopy.  Most recent labs 09/28/21- Hgb 13.5, MCV 88, GFR 91, AST 12, ALT 12, TSH 5.06   Today: GERD - controlled on protonix 40 mg BID. Denies breakthrough symptoms.   Constipation - colace 1 capsule daily and 2 on days she takes iron. BM every other day, soft, not straining, denies abdominal pain.   IDA - takes iron every other day , dark green colored stool denies melena, BRBPR, SOB, or chest pain  Cirrhosis - no fatigue, skin changes, or pruritis. No abdominal pain.  Vaginal bleeding has decreased, reports GYN wanted to do biopsy of uterus but put it off. Was told she would need hysterectomy  due to endometriosis.   Not able to take showers at home due to water situation at home.   Past Medical History:  Diagnosis Date   Acid reflux    Anxiety    Hypertension    Thyroid disease    Hypothyroid    Past Surgical History:  Procedure Laterality Date   BIOPSY  05/02/2020   Procedure: BIOPSY;  Surgeon: Corbin Ade, MD;  [facility];  Service: Endoscopy;;  gastric   CATARACT EXTRACTION     ESOPHAGOGASTRODUODENOSCOPY N/A 05/02/2020   Procedure: ESOPHAGOGASTRODUODENOSCOPY (EGD);  Surgeon: Corbin Ade, MD;  Mild erosive reflux esophagitis, benign-appearing gastric polyps but otherwise normal-appearing stomach s/p polypectomy and gastric mucosal biopsy, patent pylorus, normal examined duodenum.  Pathology with fundic gland polyp, mild chronic gastritis, negative for H. pylori.     LAPAROSCOPY  1980s   for endometriosis    perdontal     POLYPECTOMY  05/02/2020   Procedure: POLYPECTOMY;  Surgeon: Corbin Ade, MD;  [facility];  Service: Endoscopy;;  gastric   WISDOM TOOTH EXTRACTION      Current Outpatient Medications  Medication Sig Dispense Refill   acetaminophen (TYLENOL) 500 MG tablet Take 1,000 mg by mouth as needed for moderate pain or headache.     amLODipine (NORVASC) 5 MG tablet Take 5 mg by mouth 2 (two) times daily.     docusate sodium (COLACE) 100 MG capsule Take 100 mg by mouth daily.     doxazosin (CARDURA) 2  MG tablet Take 2 mg by mouth at bedtime.     Ferrous Sulfate (IRON) 325 (65 Fe) MG TABS Take by mouth every other day.     levothyroxine (SYNTHROID, LEVOTHROID) 25 MCG tablet Take 25 mcg by mouth every morning.  6   lisinopril (PRINIVIL,ZESTRIL) 40 MG tablet Take 40 mg by mouth every morning.  2   loratadine (CLARITIN) 10 MG tablet Take 10 mg by mouth every morning.     pantoprazole (PROTONIX) 40 MG tablet Take 1 tablet (40mg ) by mouth once or twice daily before a meal for acid reflux. 60 tablet 5   rosuvastatin (CRESTOR) 10 MG  tablet Take 10 mg by mouth daily.     No current facility-administered medications for this visit.    Allergies as of 05/03/2022 - Review Complete 05/03/2022  Allergen Reaction Noted   Codeine Nausea Only 06/21/2018   Tetracyclines & related Other (See Comments) 06/21/2018    Family History  Problem Relation Age of Onset   Emphysema Mother    Diabetes Father    Hypertension Father    Stroke Father    Colon cancer Neg Hx    Stomach cancer Neg Hx    Esophageal cancer Neg Hx    Pancreatic cancer Neg Hx    Breast cancer Neg Hx    Liver disease Neg Hx     Social History   Socioeconomic History   Marital status: Widowed    Spouse name: Not on file   Number of children: Not on file   Years of education: Not on file   Highest education level: Not on file  Occupational History   Not on file  Tobacco Use   Smoking status: Every Day    Packs/day: 1.00    Types: Cigarettes   Smokeless tobacco: Never  Vaping Use   Vaping Use: Never used  Substance and Sexual Activity   Alcohol use: Not Currently   Drug use: Never   Sexual activity: Not Currently    Birth control/protection: None, Post-menopausal  Other Topics Concern   Not on file  Social History Narrative   Not on file   Social Determinants of Health   Financial Resource Strain: Low Risk    Difficulty of Paying Living Expenses: Not hard at all  Food Insecurity: No Food Insecurity   Worried About Programme researcher, broadcasting/film/videounning Out of Food in the Last Year: Never true   Ran Out of Food in the Last Year: Never true  Transportation Needs: No Transportation Needs   Lack of Transportation (Medical): No   Lack of Transportation (Non-Medical): No  Physical Activity: Inactive   Days of Exercise per Week: 0 days   Minutes of Exercise per Session: 0 min  Stress: No Stress Concern Present   Feeling of Stress : Only a little  Social Connections: Moderately Isolated   Frequency of Communication with Friends and Family: Twice a week   Frequency of  Social Gatherings with Friends and Family: Once a week   Attends Religious Services: Never   Database administratorActive Member of Clubs or Organizations: No   Attends Engineer, structuralClub or Organization Meetings: Never   Marital Status: Married     Review of Systems   Gen: Denies fever, chills, anorexia. Denies fatigue, weakness, weight loss.  CV: Denies chest pain, palpitations, syncope, peripheral edema, and claudication. Resp: Denies dyspnea at rest, cough, wheezing, coughing up blood, and pleurisy. GI: See HPI Derm: Denies rash, itching, dry skin Psych: Denies depression, anxiety, memory loss, confusion. No homicidal or  suicidal ideation.  Heme: Denies bruising, bleeding, and enlarged lymph nodes.   Physical Exam   BP 128/60   Pulse 86   Temp 97.6 F (36.4 C) (Temporal)   Ht 5\' 4"  (1.626 m)   Wt 130 lb (59 kg)   BMI 22.31 kg/m   General:   Alert and oriented. No distress noted. Pleasant and cooperative.  Head:  Normocephalic and atraumatic. Eyes:  Conjuctiva clear without scleral icterus. Mouth:  Oral mucosa pink and moist. Good dentition. No lesions. Lungs:  Clear to auscultation bilaterally. No wheezes, rales, or rhonchi. No distress.  Heart:  S1, S2 present without murmurs appreciated.  Abdomen:  +BS, soft, non-tender and non-distended. No rebound or guarding. No HSM or masses noted. Rectal: Deferred Msk:  Symmetrical without gross deformities. Normal posture. Extremities:  Without edema. Neurologic:  Alert and  oriented x4 Psych:  Alert and cooperative. Normal mood and affect.   Assessment  Sydney Jones is a 76 y.o. female with a history of GERD, anxiety, HTN, hypothyroidism, and IDA  presenting today for follow up of GERD, possible cirrhosis, IDA.   GERD: Controlled on pantoprazole 40 mg twice daily.  No breakthrough symptoms.  Refill sent into [facility] per patient's request.  History of IDA: History of heme positive stool. No prior colonoscopy. Last EGD 2021 with erosive esophagitis and  mild gastritis.  No alarm symptoms present.  Continues to have issues with water situation at home, does not feel like proceeding with colonoscopy at this time.  Currently taking iron every other day.  Constipation: Controlled now.  Taking Colace 1 capsule every other day.  Taking 2 capsules on the days that she takes her iron supplement.  Possible cirrhosis: 2022 and CT in July 2022 with possible nodular contour of liver with normal kPA. Last labs from October 2022 with normal LFTs, further workup previously deferred due to GYN issues and financial difficulties.  Denies any abdominal pain, melena,, hematochezia, pruritus, swelling, fatigue.  Discussed management of cirrhosis and further evaluation.  Patient agreed to further work-up with lab work today.  Declines right upper quadrant ultrasound due to financial cost.  Advised we will be in touch with lab work once received, follow-up in 6 months.  PLAN   CBC, CMP, PT/INR, General Hepatitis Panel, ANA, ASMA, AMA, Iron panel Continue pantoprazole 40 mg BID, refilled today.  Continue oral iron. Continue Colace regimen for constipation. Follow up in 6 months    November 2022, MSN, FNP-BC, AGACNP-BC [facility]

## 2022-05-03 ENCOUNTER — Ambulatory Visit (INDEPENDENT_AMBULATORY_CARE_PROVIDER_SITE_OTHER): Payer: Medicare Other | Admitting: Gastroenterology

## 2022-05-03 ENCOUNTER — Encounter: Payer: Self-pay | Admitting: Gastroenterology

## 2022-05-03 VITALS — BP 128/60 | HR 86 | Temp 97.6°F | Ht 64.0 in | Wt 130.0 lb

## 2022-05-03 DIAGNOSIS — K2100 Gastro-esophageal reflux disease with esophagitis, without bleeding: Secondary | ICD-10-CM | POA: Diagnosis not present

## 2022-05-03 DIAGNOSIS — D509 Iron deficiency anemia, unspecified: Secondary | ICD-10-CM | POA: Diagnosis not present

## 2022-05-03 DIAGNOSIS — K7460 Unspecified cirrhosis of liver: Secondary | ICD-10-CM

## 2022-05-03 MED ORDER — PANTOPRAZOLE SODIUM 40 MG PO TBEC: DELAYED_RELEASE_TABLET | ORAL | 5 refills | Status: DC

## 2022-05-03 NOTE — Patient Instructions (Addendum)
I have ordered your lab work to be completed at [facility] [employer].  I will be in touch about the results of your lab work and any recommendations after of had time to review them.  Continue pantoprazole 40 mg twice daily, I sent the refill into [facility].   We will have you follow-up in about 6 months.  It was a pleasure to see you today. I want to create trusting relationships with patients. If you receive a survey regarding your visit,  I greatly appreciate you taking time to fill this out on paper or through your MyChart. I value your feedback.  Brooke Bonito, MSN, FNP-BC, AGACNP-BC [facility]

## 2022-05-10 NOTE — Progress Notes (Signed)
Mailed lab requisitions. Informed pt to have labs done ASAP

## 2022-05-19 ENCOUNTER — Other Ambulatory Visit (HOSPITAL_COMMUNITY)
Admission: RE | Admit: 2022-05-19 | Discharge: 2022-05-19 | Disposition: A | Discharge status: Home / Self Care | Payer: Medicare Other | Source: Ambulatory Visit | Attending: Gastroenterology | Admitting: Gastroenterology

## 2022-05-19 DIAGNOSIS — K7460 Unspecified cirrhosis of liver: Secondary | ICD-10-CM | POA: Insufficient documentation

## 2022-05-19 LAB — HEPATITIS B CORE ANTIBODY, TOTAL: Hep B Core Total Ab: NONREACTIVE

## 2022-05-19 LAB — CBC
HCT: 39 % (ref 36.0–46.0)
Hemoglobin: 13.1 g/dL (ref 12.0–15.0)
MCH: 29.8 pg (ref 26.0–34.0)
MCHC: 33.6 g/dL (ref 30.0–36.0)
MCV: 88.8 fL (ref 80.0–100.0)
Platelets: 248 10*3/uL (ref 150–400)
RBC: 4.39 MIL/uL (ref 3.87–5.11)
RDW: 14.1 % (ref 11.5–15.5)
WBC: 8.6 10*3/uL (ref 4.0–10.5)
nRBC: 0 % (ref 0.0–0.2)

## 2022-05-19 LAB — COMPREHENSIVE METABOLIC PANEL
ALT: 16 U/L (ref 0–44)
AST: 14 U/L — ABNORMAL LOW (ref 15–41)
Albumin: 4 g/dL (ref 3.5–5.0)
Alkaline Phosphatase: 52 U/L (ref 38–126)
Anion gap: 9 (ref 5–15)
BUN: 17 mg/dL (ref 8–23)
CO2: 25 mmol/L (ref 22–32)
Calcium: 9.1 mg/dL (ref 8.9–10.3)
Chloride: 95 mmol/L — ABNORMAL LOW (ref 98–111)
Creatinine, Ser: 0.57 mg/dL (ref 0.44–1.00)
GFR, Estimated: >60 mL/min (ref >60)
Glucose, Bld: 107 mg/dL — ABNORMAL HIGH (ref 70–99)
Potassium: 4 mmol/L (ref 3.5–5.1)
Sodium: 129 mmol/L — ABNORMAL LOW (ref 135–145)
Total Bilirubin: 0.6 mg/dL (ref 0.3–1.2)
Total Protein: 7.7 g/dL (ref 6.5–8.1)

## 2022-05-19 LAB — IRON AND TIBC
Iron: 55 ug/dL (ref 28–170)
Saturation Ratios: 18 % (ref 10.4–31.8)
TIBC: 308 ug/dL (ref 250–450)
UIBC: 253 ug/dL

## 2022-05-19 LAB — HEPATITIS B SURFACE ANTIGEN: Hepatitis B Surface Ag: NONREACTIVE

## 2022-05-19 LAB — FERRITIN: Ferritin: 60 ng/mL (ref 11–307)

## 2022-05-19 LAB — HEPATITIS C ANTIBODY: HCV Ab: NONREACTIVE

## 2022-05-19 LAB — HEPATITIS A ANTIBODY, TOTAL: hep A Total Ab: REACTIVE — AB

## 2022-05-20 LAB — MITOCHONDRIAL ANTIBODIES: Mitochondrial M2 Ab, IgG: <20 Units (ref 0.0–20.0)

## 2022-05-20 LAB — ANA: Anti Nuclear Antibody (ANA): POSITIVE — AB

## 2022-05-20 LAB — ANTI-SMOOTH MUSCLE ANTIBODY, IGG: F-Actin IgG: 34 Units — ABNORMAL HIGH (ref 0–19)

## 2022-05-22 LAB — HEPATITIS B SURFACE ANTIBODY, QUANTITATIVE: Hep B S AB Quant (Post): <3.1 m[IU]/mL — ABNORMAL LOW (ref >9.9)

## 2022-05-24 ENCOUNTER — Other Ambulatory Visit: Payer: Self-pay | Admitting: *Deleted

## 2022-05-24 DIAGNOSIS — R7989 Other specified abnormal findings of blood chemistry: Secondary | ICD-10-CM

## 2022-06-02 IMAGING — DX DG KNEE 1-2V*R*
2 series · 2 of 2 positions shown · non-contrast
Comparison: None.

CLINICAL DATA: Chronic right knee pain.

EXAM:
RIGHT KNEE - 1-2 VIEW

[dg knee complete 4 views right (1 of 2)]
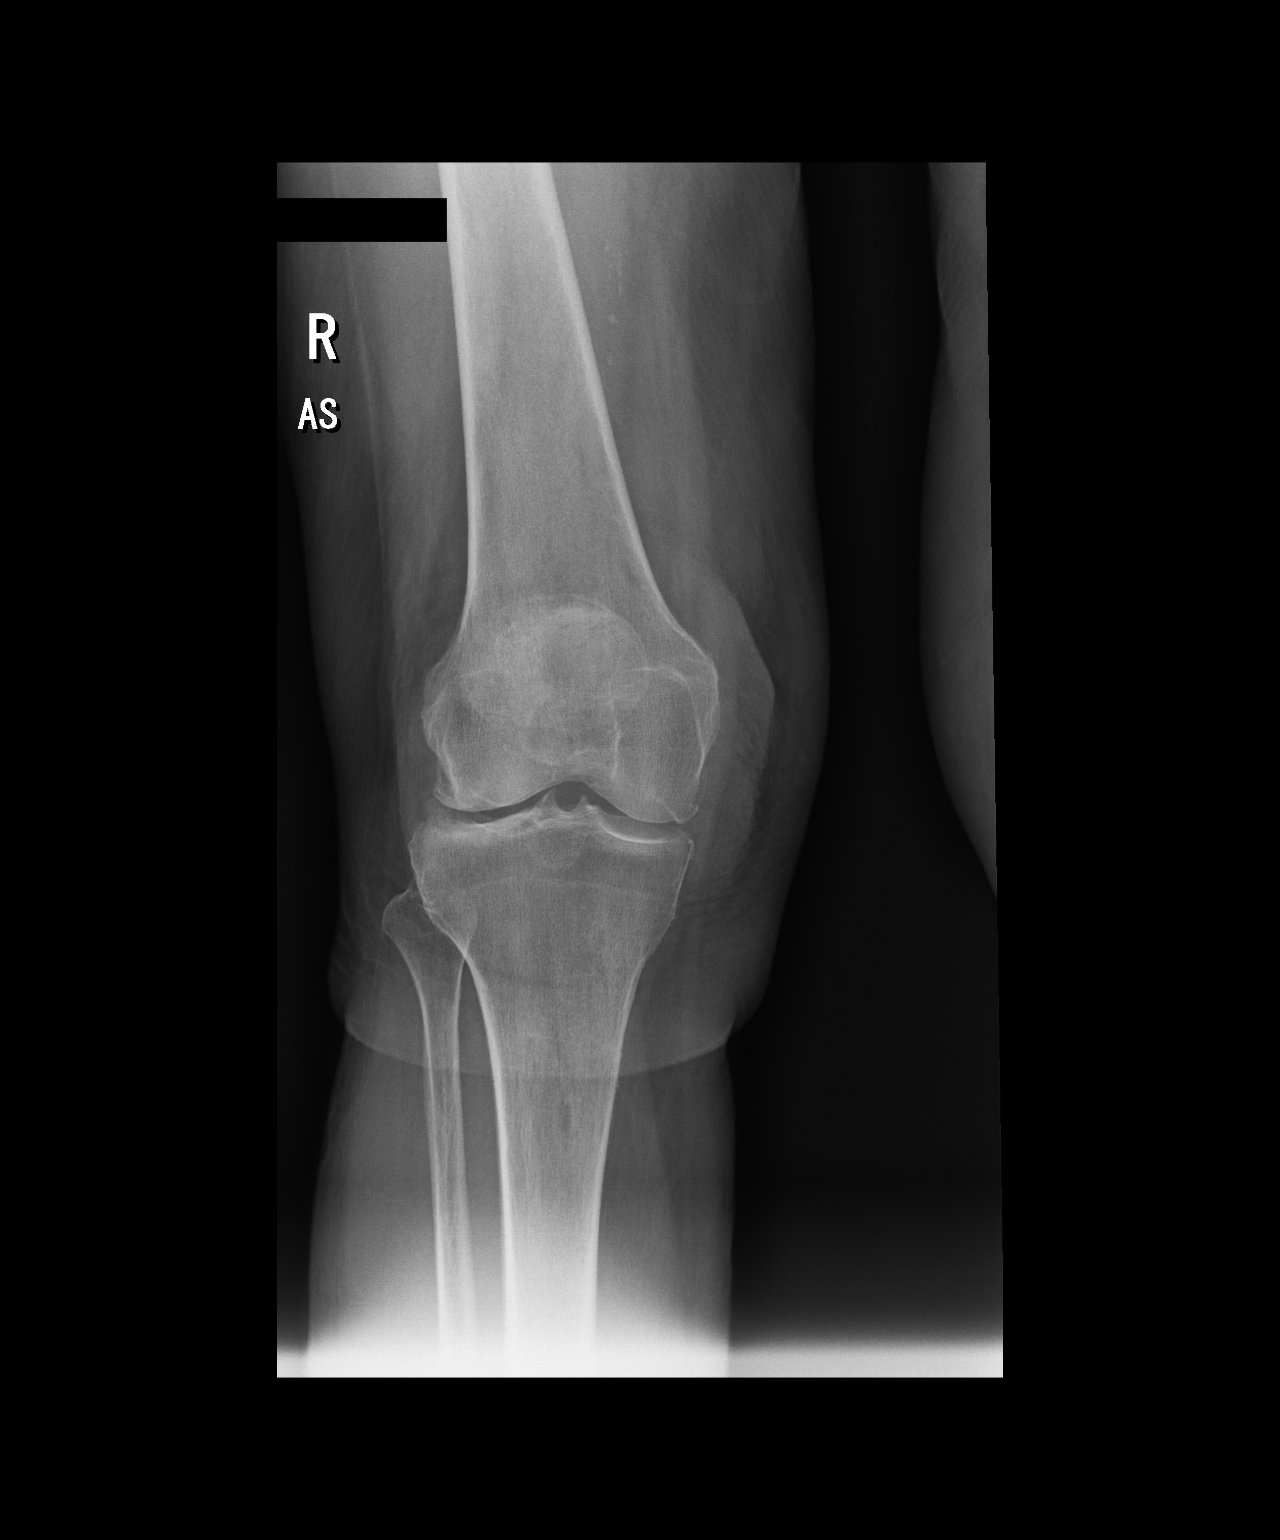

[dg knee complete 4 views right (2 of 2)]
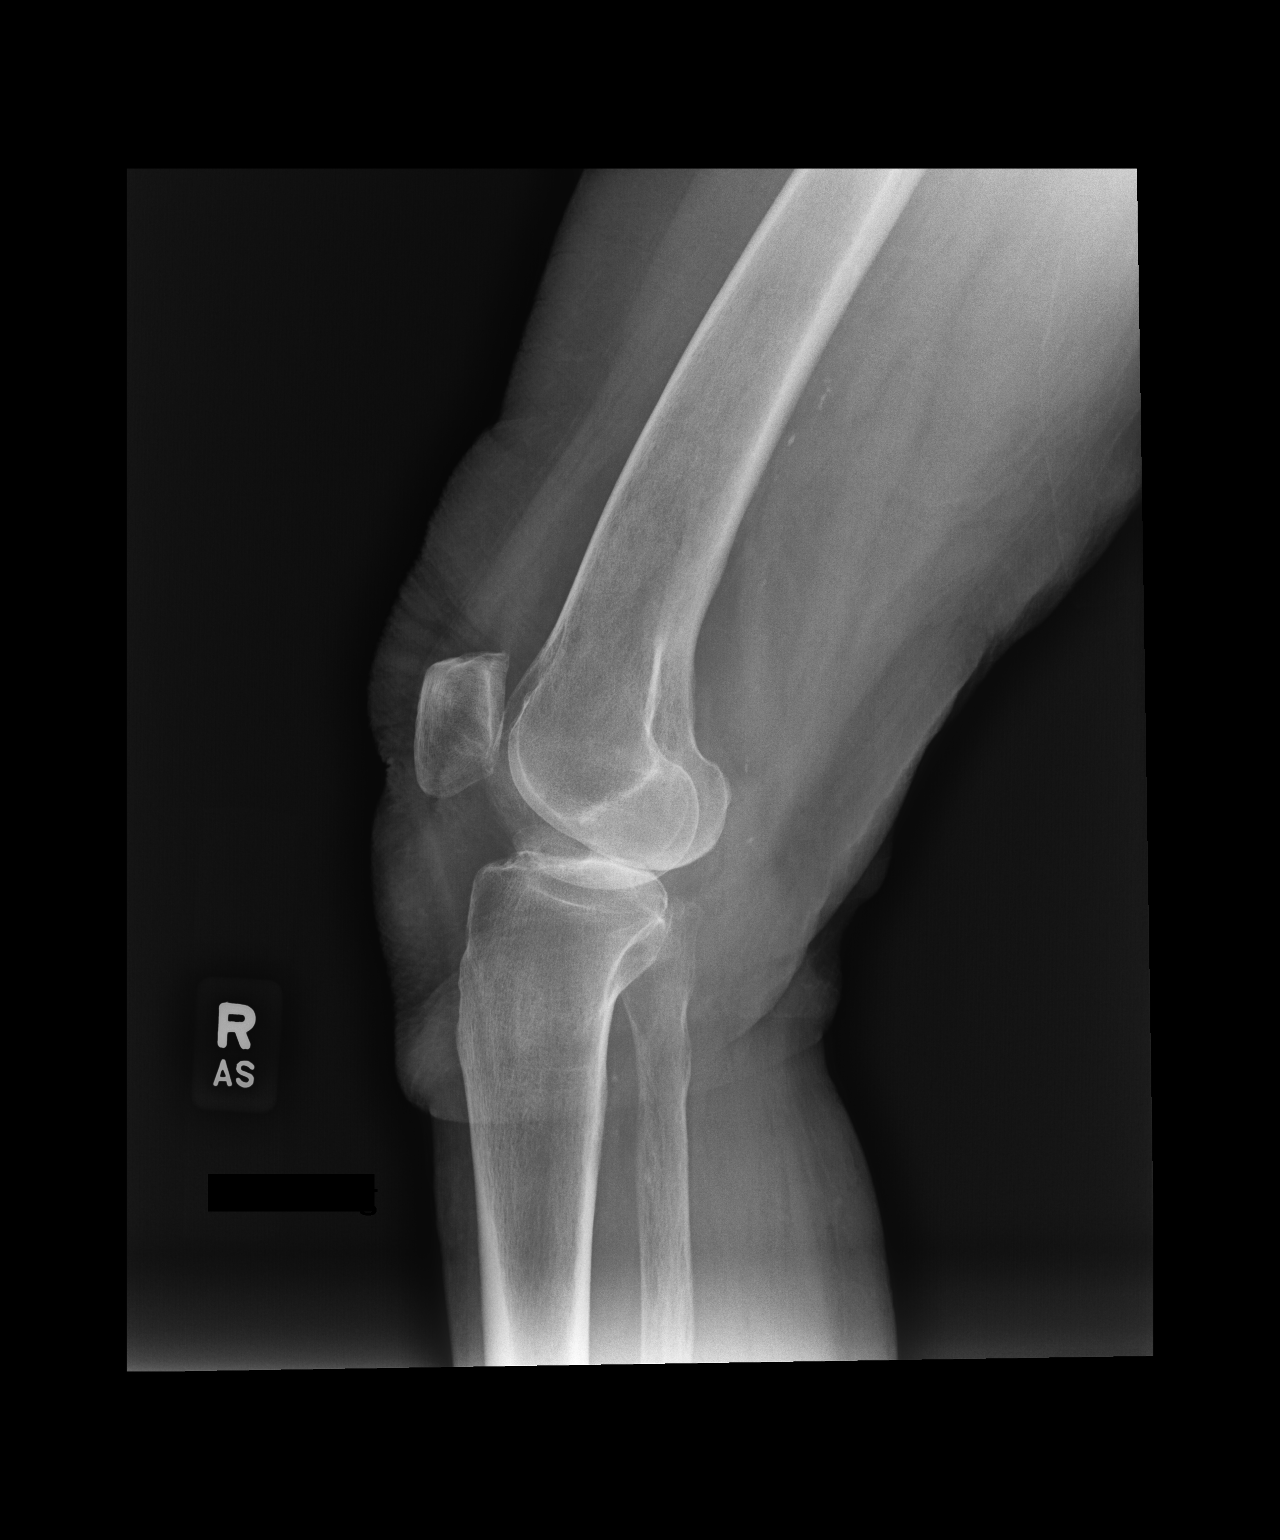

[2 of 2 positions shown; findings below may reference images not displayed]

FINDINGS: Two view study. No fracture or dislocation. Loss of joint space
noted in both the medial and lateral compartments with mild
hypertrophic spurring in all 3 compartments. Small joint effusion
noted. No worrisome lytic or sclerotic osseous abnormality.
IMPRESSION: Tricompartmental degenerative changes with small joint effusion.

## 2022-07-23 ENCOUNTER — Other Ambulatory Visit: Payer: Self-pay | Admitting: *Deleted

## 2022-07-23 DIAGNOSIS — R7989 Other specified abnormal findings of blood chemistry: Secondary | ICD-10-CM

## 2022-08-16 ENCOUNTER — Other Ambulatory Visit (HOSPITAL_COMMUNITY)
Admission: RE | Admit: 2022-08-16 | Discharge: 2022-08-16 | Disposition: A | Discharge status: Home / Self Care | Payer: Medicare Other | Source: Ambulatory Visit | Attending: Gastroenterology | Admitting: Gastroenterology

## 2022-08-16 DIAGNOSIS — R7989 Other specified abnormal findings of blood chemistry: Secondary | ICD-10-CM | POA: Insufficient documentation

## 2022-08-16 LAB — HEPATIC FUNCTION PANEL
ALT: 15 U/L (ref 0–44)
AST: 16 U/L (ref 15–41)
Albumin: 3.9 g/dL (ref 3.5–5.0)
Alkaline Phosphatase: 55 U/L (ref 38–126)
Bilirubin, Direct: 0.1 mg/dL (ref 0.0–0.2)
Indirect Bilirubin: 0.6 mg/dL (ref 0.3–0.9)
Total Bilirubin: 0.7 mg/dL (ref 0.3–1.2)
Total Protein: 7.9 g/dL (ref 6.5–8.1)

## 2022-08-17 LAB — IGG, IGA, IGM
IgA: 263 mg/dL (ref 64–422)
IgG (Immunoglobin G), Serum: 1367 mg/dL (ref 586–1602)
IgM (Immunoglobulin M), Srm: 191 mg/dL (ref 26–217)

## 2022-08-17 LAB — ANA: Anti Nuclear Antibody (ANA): POSITIVE — AB

## 2022-10-05 DIAGNOSIS — E039 Hypothyroidism, unspecified: Secondary | ICD-10-CM | POA: Diagnosis not present

## 2022-10-05 DIAGNOSIS — I700 Atherosclerosis of aorta: Secondary | ICD-10-CM | POA: Diagnosis not present

## 2022-10-05 DIAGNOSIS — E7800 Pure hypercholesterolemia, unspecified: Secondary | ICD-10-CM | POA: Diagnosis not present

## 2022-10-05 DIAGNOSIS — E871 Hypo-osmolality and hyponatremia: Secondary | ICD-10-CM | POA: Diagnosis not present

## 2022-10-05 DIAGNOSIS — R7303 Prediabetes: Secondary | ICD-10-CM | POA: Diagnosis not present

## 2022-10-05 DIAGNOSIS — J449 Chronic obstructive pulmonary disease, unspecified: Secondary | ICD-10-CM | POA: Diagnosis not present

## 2022-10-05 DIAGNOSIS — J439 Emphysema, unspecified: Secondary | ICD-10-CM | POA: Diagnosis not present

## 2022-10-05 DIAGNOSIS — Z0000 Encounter for general adult medical examination without abnormal findings: Secondary | ICD-10-CM | POA: Diagnosis not present

## 2022-10-05 DIAGNOSIS — K7460 Unspecified cirrhosis of liver: Secondary | ICD-10-CM | POA: Diagnosis not present

## 2022-10-05 DIAGNOSIS — K219 Gastro-esophageal reflux disease without esophagitis: Secondary | ICD-10-CM | POA: Diagnosis not present

## 2022-10-05 DIAGNOSIS — Z23 Encounter for immunization: Secondary | ICD-10-CM | POA: Diagnosis not present

## 2022-10-05 DIAGNOSIS — I10 Essential (primary) hypertension: Secondary | ICD-10-CM | POA: Diagnosis not present

## 2022-11-04 DIAGNOSIS — H02834 Dermatochalasis of left upper eyelid: Secondary | ICD-10-CM | POA: Diagnosis not present

## 2022-11-04 DIAGNOSIS — H02831 Dermatochalasis of right upper eyelid: Secondary | ICD-10-CM | POA: Diagnosis not present

## 2022-11-04 DIAGNOSIS — Z961 Presence of intraocular lens: Secondary | ICD-10-CM | POA: Diagnosis not present

## 2022-11-18 ENCOUNTER — Other Ambulatory Visit: Payer: Self-pay | Admitting: Gastroenterology

## 2022-11-18 DIAGNOSIS — K2100 Gastro-esophageal reflux disease with esophagitis, without bleeding: Secondary | ICD-10-CM

## 2022-12-09 DIAGNOSIS — H02831 Dermatochalasis of right upper eyelid: Secondary | ICD-10-CM | POA: Diagnosis not present

## 2022-12-09 DIAGNOSIS — H02834 Dermatochalasis of left upper eyelid: Secondary | ICD-10-CM | POA: Diagnosis not present

## 2022-12-10 NOTE — Progress Notes (Unsigned)
GI Office Note    Referring Provider: Wenda Low, MD Primary Care Physician:  Wenda Low, MD Primary Gastroenterologist: Cristopher Estimable.Rourk, MD  Date:  12/13/2022  ID:  Sydney Jones, DOB 06/01/1946, MRN [id number]   Chief Complaint   Chief Complaint  Patient presents with   Follow-up   History of Present Illness  Sydney Jones is a 77 y.o. female with a history of mild constipation, GERD, IDA, HTN, hypothyroidism, anxiety, and cirrhosis presenting today for follow-up.  Prior CT A/P with contrast, pelvic ultrasound, abdominal ultrasound elastography in July 2022.  Ultrasound nodular liver contour with concern for cirrhosis which was also noted on CT scan.  K PA 4.2.  Bilateral ovarian lesions noted advised to follow-up with gynecology for vaginal bleeding and need for endometrial biopsy.  Given financial difficulties after loss of her husband she previously declined colonoscopy due to financial reasons.  Last office visit 05/03/2022. GERD controlled with PPI twice daily, denied breakthrough symptoms.  Taking 1 Colace daily and 2 on days she takes iron.  Has a bowel movement that is soft every other day without abdominal pain.  Taking iron every other day.  Denies fatigue, pruritus, mental status changes.  Reported vaginal bleeding had decreased.  Patient reports embarrassment over having no water at home for shower.  Most recent labs 05/19/2022: Normal CBC, sodium 129, albumin 4, AST 14, ALT 16, alk phos 52, T. bili 0.6.  ANA positive.  ASMA elevated at 34.  AMA negative negative for viral hepatitis, weak immunity to hepatitis B.  Normal iron and ferritin.  Recommended to have immunoglobulins rechecked as well as ANA and ASMA in 3 months.  Labs ordered to be completed.  PPI twice daily refilled.  Continue iron and stool softener regimen for constipation.  Follow-up in 6 months.  Patient declined RUQ ultrasound due to financial cost.  Labs 08/16/2022: ANA positive, LFTs within normal  limits, normal immunoglobulins.  ASMA lab not performed.  Today:  GERD: Controlled on pantoprazole 40 mg twice daily.   Constipation: BM every other day. Sometimes may have a BM 2 days in a row. Still doing colace regimen. No straining. No melena or BRBPR.   IDA: Taking oral iron eery other day. Energy level is the same. No SOB, chest pain, restless leg.No PICA. No dizziness to lightheadedness.   Cirrhosis history: Hematemesis/coffee ground emesis: None History of variceal bleeding: No Abdominal pain: None  Abdominal distention/worsening ascites: None Fever/chills: None Episodes of confusion/disorientation: None Number of daily bowel movements: 1 Taking diuretics?: No Date of last EGD: June 2021 Prior history of banding?: No Prior episodes of SBP: No Last time liver imaging was performed: July 2022  MELD score: N/A, no INR on file. Not drawn with prior labs in June.     Current Outpatient Medications  Medication Sig Dispense Refill   acetaminophen (TYLENOL) 500 MG tablet Take 1,000 mg by mouth as needed for moderate pain or headache.     amLODipine (NORVASC) 5 MG tablet Take 5 mg by mouth 2 (two) times daily.     docusate sodium (COLACE) 100 MG capsule Take 100 mg by mouth daily.     doxazosin (CARDURA) 2 MG tablet Take 2 mg by mouth at bedtime.     Ferrous Sulfate (IRON) 325 (65 Fe) MG TABS Take by mouth every other day.     levothyroxine (SYNTHROID, LEVOTHROID) 25 MCG tablet Take 25 mcg by mouth every morning.  6   lisinopril (PRINIVIL,ZESTRIL) 40 MG tablet  Take 40 mg by mouth every morning.  2   loratadine (CLARITIN) 10 MG tablet Take 10 mg by mouth every morning.     rosuvastatin (CRESTOR) 10 MG tablet Take 10 mg by mouth daily.     pantoprazole (PROTONIX) 40 MG tablet TAKE 1 TABLET BY MOUTH ONCE OR TWICE DAILY BEFORE MEAL(S) FOR  ACID  REFLUX. 60 tablet 5   No current facility-administered medications for this visit.    Past Medical History:  Diagnosis Date   Acid  reflux    Anxiety    Hypertension    Thyroid disease    Hypothyroid    Past Surgical History:  Procedure Laterality Date   BIOPSY  05/02/2020   Procedure: BIOPSY;  Surgeon: Daneil Dolin, MD;  [facility];  Service: Endoscopy;;  gastric   CATARACT EXTRACTION     ESOPHAGOGASTRODUODENOSCOPY N/A 05/02/2020   Procedure: ESOPHAGOGASTRODUODENOSCOPY (EGD);  Surgeon: Daneil Dolin, MD;  Mild erosive reflux esophagitis, benign-appearing gastric polyps but otherwise normal-appearing stomach s/p polypectomy and gastric mucosal biopsy, patent pylorus, normal examined duodenum.  Pathology with fundic gland polyp, mild chronic gastritis, negative for H. pylori.     LAPAROSCOPY  1980s   for endometriosis    perdontal     POLYPECTOMY  05/02/2020   Procedure: POLYPECTOMY;  Surgeon: Daneil Dolin, MD;  [facility];  Service: Endoscopy;;  gastric   WISDOM TOOTH EXTRACTION      Family History  Problem Relation Age of Onset   Emphysema Mother    Diabetes Father    Hypertension Father    Stroke Father    Colon cancer Neg Hx    Stomach cancer Neg Hx    Esophageal cancer Neg Hx    Pancreatic cancer Neg Hx    Breast cancer Neg Hx    Liver disease Neg Hx     Allergies as of 12/13/2022 - Review Complete 12/13/2022  Allergen Reaction Noted   Codeine Nausea Only 06/21/2018   Tetracyclines & related Other (See Comments) 06/21/2018    Social History   Socioeconomic History   Marital status: Widowed    Spouse name: Not on file   Number of children: Not on file   Years of education: Not on file   Highest education level: Not on file  Occupational History   Not on file  Tobacco Use   Smoking status: Every Day    Packs/day: 1.00    Types: Cigarettes   Smokeless tobacco: Never  Vaping Use   Vaping Use: Never used  Substance and Sexual Activity   Alcohol use: Not Currently   Drug use: Never   Sexual activity: Not Currently    Birth control/protection: None,  Post-menopausal  Other Topics Concern   Not on file  Social History Narrative   Not on file   Social Determinants of Health   Financial Resource Strain: Low Risk  (06/29/2021)   Overall Financial Resource Strain (CARDIA)    Difficulty of Paying Living Expenses: Not hard at all  Food Insecurity: No Food Insecurity (06/29/2021)   Hunger Vital Sign    Worried About Running Out of Food in the Last Year: Never true    [place] in the Last Year: Never true  Transportation Needs: No Transportation Needs (06/29/2021)   PRAPARE - Hydrologist (Medical): No    Lack of Transportation (Non-Medical): No  Physical Activity: Inactive (06/29/2021)   Exercise Vital Sign  Days of Exercise per Week: 0 days    Minutes of Exercise per Session: 0 min  Stress: No Stress Concern Present (06/29/2021)   [employer] of Occupational Health - Occupational Stress Questionnaire    Feeling of Stress : Only a little  Social Connections: Moderately Isolated (06/29/2021)   Social Connection and Isolation Panel [NHANES]    Frequency of Communication with Friends and Family: Twice a week    Frequency of Social Gatherings with Friends and Family: Once a week    Attends Religious Services: Never    Database administrator or Organizations: No    Attends Engineer, structural: Never    Marital Status: Married     Review of Systems   Gen: Denies fever, chills, anorexia. Denies fatigue, weakness, weight loss.  CV: Denies chest pain, palpitations, syncope, peripheral edema, and claudication. Resp: Denies dyspnea at rest, cough, wheezing, coughing up blood, and pleurisy. GI: See HPI Derm: Denies rash, itching, dry skin Psych: Denies depression, anxiety, memory loss, confusion. No homicidal or suicidal ideation.  Heme: Denies bruising, bleeding, and enlarged lymph nodes.   Physical Exam   BP (!) 146/69 (BP Location: Right Arm, Patient Position: Sitting, Cuff Size: Normal)    Pulse 97   Temp 97.7 F (36.5 C) (Temporal)   Ht 5\' 4"  (1.626 m)   Wt 125 lb 6.4 oz (56.9 kg)   SpO2 94%   BMI 21.52 kg/m   General:   Alert and oriented. No distress noted. Pleasant and cooperative.  Head:  Normocephalic and atraumatic. Eyes:  Conjuctiva clear without scleral icterus. Mouth:  Oral mucosa pink and moist. Good dentition. No lesions. Lungs:  Clear to auscultation bilaterally. No wheezes, rales, or rhonchi. No distress.  Heart:  S1, S2 present without murmurs appreciated.  Abdomen:  +BS, soft, non-tender and non-distended. No rebound or guarding. No HSM or masses noted. Rectal: deferred Msk:  Symmetrical without gross deformities. Normal posture. Extremities:  Without edema. Neurologic:  Alert and  oriented x4 Psych:  Alert and cooperative. Normal mood and affect.   Assessment  Sydney Jones is a 77 y.o. female with a history of mild constipation, GERD, IDA, HTN, hypothyroidism, anxiety, and cirrhosis presenting today for follow-up.  Possible cirrhosis: Imaging in July 2022 with possible nodular contour liver normal Capio elastography.  LFTs have remained normal.  Further workup with ultrasound previously deferred due to financial difficulties.  Serologic workup in June 2023 with positive ANA, ASMA very slightly elevated at 34, AMA negative.  No evidence of viral hepatitis.  Repeat labs in September with normal LFTs and immunoglobulins.  Some repeat labs not performed due to lab thinking they were duplicates.  Plan was to recheck ASMA at that time.  Will be due again for labs in March therefore we will check MELD labs including CBC and iron panel as stated below regarding IDA and obtain AFP.  She is agreeable to complete RUQ April given she now has supplemental Medicaid on top of her Medicare.  Patient prefers to have imaging done closer to summer to allow for better weather.  GERD: Well-controlled with pantoprazole 40 mg twice daily.  Medication refilled today.  Weaning  offered to patient however she stated that given it is working well she would like to continue.  We discussed potential side effects regarding long-term use and she would still like to continue.  IDA: History of heme positive stool.  Has never had a colonoscopy.  EGD in 2021 with erosive esophagitis  and mild gastritis.  Continues to not have any alarm symptoms.  Given her previous home situation she continues to defer colonoscopy and is continue to take oral iron every other day.  When labs are due next we will complete iron panel and check hemoglobin.  If any further decline will discuss again importance for colonoscopy and repeat EGD.  Constipation: Well-controlled with Colace 100 mg on days for which she does not take iron, and 200 mg on days that she takes iron.   PLAN   Labs in March (CBC, CMP, INR), ASMA, AFP, iron panel RUQ [place] in June (recall) Continue oral iron Continue pantoprazole 40 mg BID, refilled today.  GERD diet/lifestyle modifications Continue Colace 100 mg daily, 200 mg on days taking iron High-protein diet, avoid red meat. Limit sodium to 2000 mg/day Exercise at least 30 minutes a day 3 days/week.  Follow up in 6 months.    Venetia Night, MSN, FNP-BC, AGACNP-BC [facility]

## 2022-12-13 ENCOUNTER — Encounter: Payer: Self-pay | Admitting: Gastroenterology

## 2022-12-13 ENCOUNTER — Ambulatory Visit (INDEPENDENT_AMBULATORY_CARE_PROVIDER_SITE_OTHER): Payer: Medicare Other | Admitting: Gastroenterology

## 2022-12-13 VITALS — BP 146/69 | HR 97 | Temp 97.7°F | Ht 64.0 in | Wt 125.4 lb

## 2022-12-13 DIAGNOSIS — K2100 Gastro-esophageal reflux disease with esophagitis, without bleeding: Secondary | ICD-10-CM | POA: Diagnosis not present

## 2022-12-13 DIAGNOSIS — K7460 Unspecified cirrhosis of liver: Secondary | ICD-10-CM | POA: Diagnosis not present

## 2022-12-13 DIAGNOSIS — E611 Iron deficiency: Secondary | ICD-10-CM | POA: Diagnosis not present

## 2022-12-13 DIAGNOSIS — K5900 Constipation, unspecified: Secondary | ICD-10-CM | POA: Diagnosis not present

## 2022-12-13 MED ORDER — PANTOPRAZOLE SODIUM 40 MG PO TBEC: DELAYED_RELEASE_TABLET | ORAL | 5 refills | Status: DC

## 2022-12-13 NOTE — Patient Instructions (Addendum)
Continue pantoprazole 40 mg twice daily. Follow a GERD diet:  Avoid fried, fatty, greasy, spicy, citrus foods. Avoid caffeine and carbonated beverages. Avoid chocolate. Try eating 4-6 small meals a day rather than 3 large meals. Do not eat within 3 hours of laying down. Prop head of bed up on wood or bricks to create a 6 inch incline.  Continue your current Colace regimen.  Continue oral iron every other day.  Will plan to recheck labs in 2 months.  Nutrition:  High-protein diet from a primarily plant-based diet. Avoid red meat.  No raw or undercooked meat, seafood, or shellfish. Low-fat/cholesterol/carbohydrate diet. Limit sodium to no more than 2000 mg/day including everything that you eat and drink. Recommend at least 30 minutes of aerobic and resistance exercise 3 days/week.  We will schedule you for an ultrasound in June to screen for liver lesions.  We will check your liver labs in March along with your iron.  It was a pleasure to see you today. I want to create trusting relationships with patients. If you receive a survey regarding your visit,  I greatly appreciate you taking time to fill this out on paper or through your MyChart. I value your feedback.  Venetia Night, MSN, FNP-BC, AGACNP-BC [facility]

## 2023-01-18 ENCOUNTER — Other Ambulatory Visit: Payer: Self-pay | Admitting: *Deleted

## 2023-01-18 ENCOUNTER — Telehealth: Payer: Self-pay | Admitting: *Deleted

## 2023-01-18 DIAGNOSIS — K2100 Gastro-esophageal reflux disease with esophagitis, without bleeding: Secondary | ICD-10-CM

## 2023-01-18 DIAGNOSIS — K7460 Unspecified cirrhosis of liver: Secondary | ICD-10-CM

## 2023-01-18 DIAGNOSIS — D509 Iron deficiency anemia, unspecified: Secondary | ICD-10-CM

## 2023-01-18 NOTE — Telephone Encounter (Signed)
Mailed lab requisitions to be completed in March.

## 2023-02-18 DIAGNOSIS — K7460 Unspecified cirrhosis of liver: Secondary | ICD-10-CM | POA: Diagnosis not present

## 2023-02-18 DIAGNOSIS — D509 Iron deficiency anemia, unspecified: Secondary | ICD-10-CM | POA: Diagnosis not present

## 2023-02-18 DIAGNOSIS — K2100 Gastro-esophageal reflux disease with esophagitis, without bleeding: Secondary | ICD-10-CM | POA: Diagnosis not present

## 2023-02-19 LAB — COMPREHENSIVE METABOLIC PANEL
ALT: 14 IU/L (ref 0–32)
AST: 14 IU/L (ref 0–40)
Albumin/Globulin Ratio: 1.6 (ref 1.2–2.2)
Albumin: 4.5 g/dL (ref 3.8–4.8)
Alkaline Phosphatase: 69 IU/L (ref 44–121)
BUN/Creatinine Ratio: 16 (ref 12–28)
BUN: 11 mg/dL (ref 8–27)
Bilirubin Total: 0.4 mg/dL (ref 0.0–1.2)
CO2: 22 mmol/L (ref 20–29)
Calcium: 10.1 mg/dL (ref 8.7–10.3)
Chloride: 90 mmol/L — ABNORMAL LOW (ref 96–106)
Creatinine, Ser: 0.69 mg/dL (ref 0.57–1.00)
Globulin, Total: 2.9 g/dL (ref 1.5–4.5)
Glucose: 104 mg/dL — ABNORMAL HIGH (ref 70–99)
Potassium: 4.8 mmol/L (ref 3.5–5.2)
Sodium: 132 mmol/L — ABNORMAL LOW (ref 134–144)
Total Protein: 7.4 g/dL (ref 6.0–8.5)
eGFR: 90 mL/min/{1.73_m2} (ref >59)

## 2023-02-19 LAB — CBC WITH DIFFERENTIAL/PLATELET
Basophils Absolute: 0.1 10*3/uL (ref 0.0–0.2)
Basos: 1 %
EOS (ABSOLUTE): 0.1 10*3/uL (ref 0.0–0.4)
Eos: 1 %
Hematocrit: 42.2 % (ref 34.0–46.6)
Hemoglobin: 13.9 g/dL (ref 11.1–15.9)
Immature Grans (Abs): 0.1 10*3/uL (ref 0.0–0.1)
Immature Granulocytes: 1 %
Lymphocytes Absolute: 1.3 10*3/uL (ref 0.7–3.1)
Lymphs: 13 %
MCH: 29.8 pg (ref 26.6–33.0)
MCHC: 32.9 g/dL (ref 31.5–35.7)
MCV: 91 fL (ref 79–97)
Monocytes Absolute: 0.7 10*3/uL (ref 0.1–0.9)
Monocytes: 7 %
Neutrophils Absolute: 7.6 10*3/uL — ABNORMAL HIGH (ref 1.4–7.0)
Neutrophils: 77 %
Platelets: 325 10*3/uL (ref 150–450)
RBC: 4.66 x10E6/uL (ref 3.77–5.28)
RDW: 14 % (ref 11.7–15.4)
WBC: 9.8 10*3/uL (ref 3.4–10.8)

## 2023-02-19 LAB — PROTIME-INR
INR: 1 (ref 0.9–1.2)
Prothrombin Time: 10.8 s (ref 9.1–12.0)

## 2023-02-19 LAB — IRON,TIBC AND FERRITIN PANEL
Ferritin: 182 ng/mL — ABNORMAL HIGH (ref 15–150)
Iron Saturation: 22 % (ref 15–55)
Iron: 59 ug/dL (ref 27–139)
Total Iron Binding Capacity: 263 ug/dL (ref 250–450)
UIBC: 204 ug/dL (ref 118–369)

## 2023-02-19 LAB — AFP TUMOR MARKER: AFP, Serum, Tumor Marker: 8.2 ng/mL (ref 0.0–9.2)

## 2023-03-22 ENCOUNTER — Encounter (HOSPITAL_BASED_OUTPATIENT_CLINIC_OR_DEPARTMENT_OTHER): Admission: RE | Payer: Self-pay | Source: Home / Self Care

## 2023-03-22 ENCOUNTER — Ambulatory Visit (HOSPITAL_BASED_OUTPATIENT_CLINIC_OR_DEPARTMENT_OTHER): Admission: RE | Admit: 2023-03-22 | Payer: Medicare Other | Source: Home / Self Care | Admitting: Plastic Surgery

## 2023-03-22 SURGERY — BLEPHAROPLASTY
Anesthesia: General | Site: Eye | Laterality: Bilateral

## 2023-04-04 ENCOUNTER — Ambulatory Visit (INDEPENDENT_AMBULATORY_CARE_PROVIDER_SITE_OTHER): Payer: Medicare Other | Admitting: Internal Medicine

## 2023-04-04 ENCOUNTER — Encounter: Payer: Self-pay | Admitting: Internal Medicine

## 2023-04-04 VITALS — BP 132/64 | HR 83 | Ht 63.0 in | Wt 125.0 lb

## 2023-04-04 DIAGNOSIS — Z139 Encounter for screening, unspecified: Secondary | ICD-10-CM | POA: Diagnosis not present

## 2023-04-04 DIAGNOSIS — K2100 Gastro-esophageal reflux disease with esophagitis, without bleeding: Secondary | ICD-10-CM

## 2023-04-04 DIAGNOSIS — J449 Chronic obstructive pulmonary disease, unspecified: Secondary | ICD-10-CM | POA: Diagnosis not present

## 2023-04-04 DIAGNOSIS — I10 Essential (primary) hypertension: Secondary | ICD-10-CM

## 2023-04-04 DIAGNOSIS — E785 Hyperlipidemia, unspecified: Secondary | ICD-10-CM | POA: Diagnosis not present

## 2023-04-04 DIAGNOSIS — K7460 Unspecified cirrhosis of liver: Secondary | ICD-10-CM

## 2023-04-04 DIAGNOSIS — Z1321 Encounter for screening for nutritional disorder: Secondary | ICD-10-CM

## 2023-04-04 DIAGNOSIS — E039 Hypothyroidism, unspecified: Secondary | ICD-10-CM | POA: Insufficient documentation

## 2023-04-04 DIAGNOSIS — Z0001 Encounter for general adult medical examination with abnormal findings: Secondary | ICD-10-CM

## 2023-04-04 DIAGNOSIS — Z720 Tobacco use: Secondary | ICD-10-CM

## 2023-04-04 DIAGNOSIS — D509 Iron deficiency anemia, unspecified: Secondary | ICD-10-CM | POA: Diagnosis not present

## 2023-04-04 DIAGNOSIS — Z131 Encounter for screening for diabetes mellitus: Secondary | ICD-10-CM | POA: Diagnosis not present

## 2023-04-04 MED ORDER — LISINOPRIL 40 MG PO TABS
40.0000 mg | ORAL_TABLET | Freq: Every morning | ORAL | 2 refills | Status: DC
Start: 2023-04-04 — End: 2023-12-19

## 2023-04-04 NOTE — Assessment & Plan Note (Addendum)
Previously documented history of HTN.  She is currently prescribed lisinopril 40 mg daily, amlodipine 10 mg daily, and Cardura 2 mg nightly.  BP was elevated initially at 156/72 and 148/73, but improved to 132/64 on reassessment. -No medication changes today.  Continue current antihypertensive regimen.

## 2023-04-04 NOTE — Assessment & Plan Note (Signed)
Previously documented history of iron deficiency anemia.  Iron studies within normal limits when updated in March.  She is currently taking iron supplementation every other day. -No medication changes today

## 2023-04-04 NOTE — Progress Notes (Signed)
New Patient Office Visit  Subjective    Patient ID: [name], female    DOB: [birth year]  Age: 77 y.o. MRN: [id number]  CC:  Chief Complaint  Patient presents with   Establish Care    HPI Sydney Jones presents to establish care.  She is a 77 year old woman who endorses a past medical history significant for HTN, GERD, HLD, hypothyroidism, COPD, anxiety, iron deficiency, and cirrhosis.  She was previously followed at [facility] physicians in [place] (Dr. Donette Larry).  Sydney Jones reports feeling well today.  She is asymptomatic and has no acute concerns to discuss aside from wishing to establish care.  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  She endorses current tobacco use, smoking 1 pack of cigarettes per day and has been smoking since age 89.  She denies alcohol and illicit drug use.  Her family medical history is significant for CVA, COPD, unspecified cancer, and CAD.  Chronic medical conditions and outstanding preventative care items discussed today are individually addressed in A/P below.   Outpatient Encounter Medications as of 04/04/2023  Medication Sig   acetaminophen (TYLENOL) 500 MG tablet Take 1,000 mg by mouth as needed for moderate pain or headache.   amLODipine (NORVASC) 5 MG tablet Take 5 mg by mouth 2 (two) times daily.   Cholecalciferol (D3 5000) 125 MCG (5000 UT) capsule Take 5,000 Units by mouth daily.   docusate sodium (COLACE) 100 MG capsule Take 100 mg by mouth daily.   doxazosin (CARDURA) 2 MG tablet Take 2 mg by mouth at bedtime.   Ferrous Sulfate (IRON) 325 (65 Fe) MG TABS Take by mouth every other day.   levothyroxine (SYNTHROID, LEVOTHROID) 25 MCG tablet Take 25 mcg by mouth every morning. Take 1 a day mon-Saturday and 2 on Sundays   loratadine (CLARITIN) 10 MG tablet Take 10 mg by mouth every morning.   pantoprazole (PROTONIX) 40 MG tablet TAKE 1 TABLET BY MOUTH ONCE OR TWICE DAILY BEFORE MEAL(S) FOR  ACID  REFLUX.   rosuvastatin (CRESTOR)  10 MG tablet Take 10 mg by mouth daily.   [DISCONTINUED] lisinopril (PRINIVIL,ZESTRIL) 40 MG tablet Take 40 mg by mouth every morning.   lisinopril (ZESTRIL) 40 MG tablet Take 1 tablet (40 mg total) by mouth every morning.   No facility-administered encounter medications on file as of 04/04/2023.    Past Medical History:  Diagnosis Date   Acid reflux    Anxiety    Hypertension    Thyroid disease    Hypothyroid    Past Surgical History:  Procedure Laterality Date   BIOPSY  05/02/2020   Procedure: BIOPSY;  Surgeon: Corbin Ade, MD;  [facility];  Service: Endoscopy;;  gastric   CATARACT EXTRACTION     ESOPHAGOGASTRODUODENOSCOPY N/A 05/02/2020   Procedure: ESOPHAGOGASTRODUODENOSCOPY (EGD);  Surgeon: Corbin Ade, MD;  Mild erosive reflux esophagitis, benign-appearing gastric polyps but otherwise normal-appearing stomach s/p polypectomy and gastric mucosal biopsy, patent pylorus, normal examined duodenum.  Pathology with fundic gland polyp, mild chronic gastritis, negative for H. pylori.     LAPAROSCOPY  1980s   for endometriosis    perdontal     POLYPECTOMY  05/02/2020   Procedure: POLYPECTOMY;  Surgeon: Corbin Ade, MD;  [facility];  Service: Endoscopy;;  gastric   WISDOM TOOTH EXTRACTION      Family History  Problem Relation Age of Onset   Emphysema Mother    Diabetes Father  Hypertension Father    Stroke Father    Colon cancer Neg Hx    Stomach cancer Neg Hx    Esophageal cancer Neg Hx    Pancreatic cancer Neg Hx    Breast cancer Neg Hx    Liver disease Neg Hx     Social History   Socioeconomic History   Marital status: Widowed    Spouse name: Not on file   Number of children: Not on file   Years of education: Not on file   Highest education level: Not on file  Occupational History   Not on file  Tobacco Use   Smoking status: Every Day    Packs/day: 1    Types: Cigarettes   Smokeless tobacco: Never  Vaping Use   Vaping Use:  Never used  Substance and Sexual Activity   Alcohol use: Not Currently   Drug use: Never   Sexual activity: Not Currently    Birth control/protection: None, Post-menopausal  Other Topics Concern   Not on file  Social History Narrative   Not on file   Social Determinants of Health   Financial Resource Strain: Low Risk  (06/29/2021)   Overall Financial Resource Strain (CARDIA)    Difficulty of Paying Living Expenses: Not hard at all  Food Insecurity: No Food Insecurity (06/29/2021)   Hunger Vital Sign    Worried About Running Out of Food in the Last Year: Never true    Ran Out of Food in the Last Year: Never true  Transportation Needs: No Transportation Needs (06/29/2021)   PRAPARE - Administrator, Civil Service (Medical): No    Lack of Transportation (Non-Medical): No  Physical Activity: Inactive (06/29/2021)   Exercise Vital Sign    Days of Exercise per Week: 0 days    Minutes of Exercise per Session: 0 min  Stress: No Stress Concern Present (06/29/2021)   [employer] of Occupational Health - Occupational Stress Questionnaire    Feeling of Stress : Only a little  Social Connections: Moderately Isolated (06/29/2021)   Social Connection and Isolation Panel [NHANES]    Frequency of Communication with Friends and Family: Twice a week    Frequency of Social Gatherings with Friends and Family: Once a week    Attends Religious Services: Never    Database administrator or Organizations: No    Attends Banker Meetings: Never    Marital Status: Married  Catering manager Violence: Not At Risk (06/29/2021)   Humiliation, Afraid, Rape, and Kick questionnaire    Fear of Current or Ex-Partner: No    Emotionally Abused: No    Physically Abused: No    Sexually Abused: No    Review of Systems  Constitutional:  Negative for chills and fever.  HENT:  Negative for sore throat.   Respiratory:  Negative for cough and shortness of breath.   Cardiovascular:  Negative for  chest pain, palpitations and leg swelling.  Gastrointestinal:  Negative for abdominal pain, blood in stool, constipation, diarrhea, nausea and vomiting.  Genitourinary:  Negative for dysuria and hematuria.  Musculoskeletal:  Negative for myalgias.  Skin:  Negative for itching and rash.  Neurological:  Negative for dizziness and headaches.  Psychiatric/Behavioral:  Negative for depression and suicidal ideas.    Objective    BP 132/64   Pulse 83   Ht 5\' 3"  (1.6 m)   Wt 125 lb (56.7 kg)   SpO2 93%   BMI 22.14 kg/m   Physical Exam  Vitals reviewed.  Constitutional:      General: She is not in acute distress.    Appearance: Normal appearance. She is not toxic-appearing.  HENT:     Head: Normocephalic and atraumatic.     Right Ear: External ear normal.     Left Ear: External ear normal.     Nose: Nose normal. No congestion or rhinorrhea.     Mouth/Throat:     Mouth: Mucous membranes are moist.     Pharynx: Oropharynx is clear. No oropharyngeal exudate or posterior oropharyngeal erythema.  Eyes:     General: No scleral icterus.    Extraocular Movements: Extraocular movements intact.     Conjunctiva/sclera: Conjunctivae normal.     Pupils: Pupils are equal, round, and reactive to light.  Cardiovascular:     Rate and Rhythm: Normal rate and regular rhythm.     Pulses: Normal pulses.     Heart sounds: Murmur heard.     No friction rub. No gallop.  Pulmonary:     Effort: Pulmonary effort is normal.     Breath sounds: Normal breath sounds. No wheezing, rhonchi or rales.  Abdominal:     General: Abdomen is flat. Bowel sounds are normal. There is no distension.     Palpations: Abdomen is soft.     Tenderness: There is no abdominal tenderness.  Musculoskeletal:        General: No swelling. Normal range of motion.     Cervical back: Normal range of motion.     Right lower leg: No edema.     Left lower leg: No edema.  Lymphadenopathy:     Cervical: No cervical adenopathy.   Skin:    General: Skin is warm and dry.     Capillary Refill: Capillary refill takes less than 2 seconds.     Coloration: Skin is not jaundiced.  Neurological:     General: No focal deficit present.     Mental Status: She is alert and oriented to person, place, and time.  Psychiatric:        Mood and Affect: Mood normal.        Behavior: Behavior normal.    Assessment & Plan:   Problem List Items Addressed This Visit       Essential hypertension    Previously documented history of HTN.  She is currently prescribed lisinopril 40 mg daily, amlodipine 10 mg daily, and Cardura 2 mg nightly.  BP was elevated initially at 156/72 and 148/73, but improved to 132/64 on reassessment. -No medication changes today.  Continue current antihypertensive regimen.      COPD (chronic obstructive pulmonary disease) (HCC)    She endorses a history of COPD.  States that she has previously completed PFTs but these are not available for review.  Not currently on any maintenance or rescue inhaler therapy.  Pulmonary exam is unremarkable. -No medication changes today.  Previous records have been requested.      Gastroesophageal reflux disease    Symptoms are currently well-controlled on Protonix 40 mg twice daily.  She is followed by GI (Dr. Kendell Bane). -No medication changes today      Cirrhosis of liver (HCC)    Followed by GI (Dr. Kendell Bane).  Repeat labs and RUQ U/S planned for next month.      Hypothyroidism    History of hypothyroid rhythm.  She is currently prescribed Synthroid 25 mcg daily. -Repeat TFTs ordered today      Iron deficiency anemia    Previously  documented history of iron deficiency anemia.  Iron studies within normal limits when updated in March.  She is currently taking iron supplementation every other day. -No medication changes today      Hyperlipidemia    Previously documented history of HLD.  She is currently prescribed rosuvastatin 10 mg daily. -Repeat lipid panel  ordered today      Current tobacco use    She currently smokes 1 pack/day of cigarettes and has been taking since age 45.  She is precontemplative with regards to cessation but is agreeable to lung cancer screening. -Lung cancer screening referral placed today      Encounter for general adult medical examination with abnormal findings - Primary    Presenting today to establish care.  Available records and labs have been reviewed. -Repeat labs ordered today -Records from her previous provider have been requested -Lung cancer screening referral placed -We will tentatively plan for follow-up in 6 months      Return in about 6 months (around 10/05/2023).   Billie Lade, MD

## 2023-04-04 NOTE — Assessment & Plan Note (Signed)
Symptoms are currently well-controlled on Protonix 40 mg twice daily.  She is followed by GI (Dr. Kendell Bane). -No medication changes today

## 2023-04-04 NOTE — Assessment & Plan Note (Signed)
Previously documented history of HLD.  She is currently prescribed rosuvastatin 10 mg daily. -Repeat lipid panel ordered today

## 2023-04-04 NOTE — Assessment & Plan Note (Signed)
Followed by GI (Dr. Kendell Bane).  Repeat labs and RUQ U/S planned for next month.

## 2023-04-04 NOTE — Assessment & Plan Note (Signed)
Presenting today to establish care.  Available records and labs have been reviewed. -Repeat labs ordered today -Records from her previous provider have been requested -Lung cancer screening referral placed -We will tentatively plan for follow-up in 6 months

## 2023-04-04 NOTE — Assessment & Plan Note (Signed)
She endorses a history of COPD.  States that she has previously completed PFTs but these are not available for review.  Not currently on any maintenance or rescue inhaler therapy.  Pulmonary exam is unremarkable. -No medication changes today.  Previous records have been requested.

## 2023-04-04 NOTE — Assessment & Plan Note (Signed)
History of hypothyroid rhythm.  She is currently prescribed Synthroid 25 mcg daily. -Repeat TFTs ordered today

## 2023-04-04 NOTE — Patient Instructions (Signed)
It was a pleasure to see you today.  Thank you for giving [place] the opportunity to be involved in your care.  Below is a brief recap of your visit and next steps.  We will plan to see you again in 6 months.  Summary You have established care today We will check basic labs and request records from Dr. Venita Sheffield office Lung cancer screening referral placed today Follow up in 6 months

## 2023-04-04 NOTE — Assessment & Plan Note (Signed)
She currently smokes 1 pack/day of cigarettes and has been taking since age 77.  She is precontemplative with regards to cessation but is agreeable to lung cancer screening. -Lung cancer screening referral placed today

## 2023-04-05 ENCOUNTER — Other Ambulatory Visit: Payer: Self-pay

## 2023-04-05 DIAGNOSIS — R1013 Epigastric pain: Secondary | ICD-10-CM

## 2023-04-05 DIAGNOSIS — K2100 Gastro-esophageal reflux disease with esophagitis, without bleeding: Secondary | ICD-10-CM

## 2023-04-05 LAB — CMP14+EGFR
ALT: 15 IU/L (ref 0–32)
AST: 13 IU/L (ref 0–40)
Albumin/Globulin Ratio: 1.6 (ref 1.2–2.2)
Albumin: 4.6 g/dL (ref 3.8–4.8)
Alkaline Phosphatase: 67 IU/L (ref 44–121)
BUN/Creatinine Ratio: 22 (ref 12–28)
BUN: 15 mg/dL (ref 8–27)
Bilirubin Total: 0.4 mg/dL (ref 0.0–1.2)
CO2: 23 mmol/L (ref 20–29)
Calcium: 10.2 mg/dL (ref 8.7–10.3)
Chloride: 93 mmol/L — ABNORMAL LOW (ref 96–106)
Creatinine, Ser: 0.68 mg/dL (ref 0.57–1.00)
Globulin, Total: 2.8 g/dL (ref 1.5–4.5)
Glucose: 93 mg/dL (ref 70–99)
Potassium: 5 mmol/L (ref 3.5–5.2)
Sodium: 132 mmol/L — ABNORMAL LOW (ref 134–144)
Total Protein: 7.4 g/dL (ref 6.0–8.5)
eGFR: 90 mL/min/{1.73_m2} (ref >59)

## 2023-04-05 LAB — VITAMIN D 25 HYDROXY (VIT D DEFICIENCY, FRACTURES): Vit D, 25-Hydroxy: 45.7 ng/mL (ref 30.0–100.0)

## 2023-04-05 LAB — LIPID PANEL
Chol/HDL Ratio: 2.5 ratio (ref 0.0–4.4)
Cholesterol, Total: 157 mg/dL (ref 100–199)
HDL: 63 mg/dL (ref >39)
LDL Chol Calc (NIH): 82 mg/dL (ref 0–99)
Triglycerides: 60 mg/dL (ref 0–149)
VLDL Cholesterol Cal: 12 mg/dL (ref 5–40)

## 2023-04-05 LAB — CBC WITH DIFFERENTIAL/PLATELET
Basophils Absolute: 0.2 10*3/uL (ref 0.0–0.2)
Basos: 2 %
EOS (ABSOLUTE): 0.1 10*3/uL (ref 0.0–0.4)
Eos: 1 %
Hematocrit: 41.5 % (ref 34.0–46.6)
Hemoglobin: 13.9 g/dL (ref 11.1–15.9)
Immature Grans (Abs): 0 10*3/uL (ref 0.0–0.1)
Immature Granulocytes: 0 %
Lymphocytes Absolute: 1.2 10*3/uL (ref 0.7–3.1)
Lymphs: 12 %
MCH: 30.5 pg (ref 26.6–33.0)
MCHC: 33.5 g/dL (ref 31.5–35.7)
MCV: 91 fL (ref 79–97)
Monocytes Absolute: 0.7 10*3/uL (ref 0.1–0.9)
Monocytes: 7 %
Neutrophils Absolute: 7.3 10*3/uL — ABNORMAL HIGH (ref 1.4–7.0)
Neutrophils: 78 %
Platelets: 298 10*3/uL (ref 150–450)
RBC: 4.56 x10E6/uL (ref 3.77–5.28)
RDW: 14.1 % (ref 11.7–15.4)
WBC: 9.4 10*3/uL (ref 3.4–10.8)

## 2023-04-05 LAB — HEMOGLOBIN A1C
Est. average glucose Bld gHb Est-mCnc: 123 mg/dL
Hgb A1c MFr Bld: 5.9 % — ABNORMAL HIGH (ref 4.8–5.6)

## 2023-04-05 LAB — B12 AND FOLATE PANEL
Folate: 5 ng/mL (ref >3.0)
Vitamin B-12: 611 pg/mL (ref 232–1245)

## 2023-04-05 LAB — TSH+FREE T4
Free T4: 1.44 ng/dL (ref 0.82–1.77)
TSH: 4.89 u[IU]/mL — ABNORMAL HIGH (ref 0.450–4.500)

## 2023-04-05 MED ORDER — OMEPRAZOLE 40 MG PO CPDR
40.0000 mg | DELAYED_RELEASE_CAPSULE | Freq: Two times a day (BID) | ORAL | 3 refills | Status: DC
Start: 2023-04-05 — End: 2023-06-06

## 2023-05-03 ENCOUNTER — Encounter: Payer: Self-pay | Admitting: Gastroenterology

## 2023-05-23 ENCOUNTER — Other Ambulatory Visit: Payer: Self-pay

## 2023-05-23 DIAGNOSIS — Z87891 Personal history of nicotine dependence: Secondary | ICD-10-CM

## 2023-05-23 DIAGNOSIS — F17210 Nicotine dependence, cigarettes, uncomplicated: Secondary | ICD-10-CM

## 2023-06-05 ENCOUNTER — Other Ambulatory Visit: Payer: Self-pay | Admitting: Gastroenterology

## 2023-06-05 DIAGNOSIS — K2100 Gastro-esophageal reflux disease with esophagitis, without bleeding: Secondary | ICD-10-CM

## 2023-06-07 NOTE — Progress Notes (Unsigned)
GI Office Note    Referring Provider: Billie Lade, MD Primary Care Physician:  Billie Lade, MD Primary Gastroenterologist: Gerrit Friends.Rourk, MD   Date:  06/07/2023  ID:  Sydney Jones, DOB 07/14/1946, MRN [id number]   Chief Complaint   No chief complaint on file.  History of Present Illness  Sydney Jones is a 77 y.o. female with a history of *** presenting today for follow up  Prior CT A/P with contrast, pelvic ultrasound, abdominal ultrasound elastography in July 2022.  Ultrasound nodular liver contour with concern for cirrhosis which was also noted on CT scan.  K PA 4.2.  Bilateral ovarian lesions noted advised to follow-up with gynecology for vaginal bleeding and need for endometrial biopsy.  Given financial difficulties after loss of her husband she previously declined colonoscopy due to financial reasons.   Office visit 05/03/2022. GERD controlled with PPI twice daily, denied breakthrough symptoms.  Taking 1 Colace daily and 2 on days she takes iron.  Has a bowel movement that is soft every other day without abdominal pain.  Taking iron every other day.  Denies fatigue, pruritus, mental status changes.  Reported vaginal bleeding had decreased.  Patient reports embarrassment over having no water at home for shower.  Prior workup with positive ANA and mildly elevated ASMA at 31, AMA negative, negative for viral hepatitis, and normal immunoglobulins. Previously normal ferritin and iron panel.  AFP normal in March 2024.  A1c 5.9 in May 2024.  Lipid Panel     Component Value Date/Time   CHOL 157 04/04/2023 1024   TRIG 60 04/04/2023 1024   HDL 63 04/04/2023 1024   CHOLHDL 2.5 04/04/2023 1024   LDLCALC 82 04/04/2023 1024   LABVLDL 12 04/04/2023 1024      Latest Ref Rng & Units 04/04/2023   10:24 AM 02/18/2023   10:18 AM 05/19/2022    9:49 AM  CBC  WBC 3.4 - 10.8 x10E3/uL 9.4  9.8  8.6   Hemoglobin 11.1 - 15.9 g/dL 40.9  81.1  91.4   Hematocrit 34.0 - 46.6 % 41.5   42.2  39.0   Platelets 150 - 450 x10E3/uL 298  325  248     Today: Cirrhosis history Hematemesis/coffee ground emesis: *** History of variceal bleeding: No Abdominal pain: *** Abdominal distention/worsening ascites: *** Fever/chills: *** Episodes of confusion/disorientation: *** Number of daily bowel movements: *** Taking diuretics?: *** Date of last EGD: June 2021 Prior history of banding?: None Prior episodes of SBP: No Last time liver imaging was performed: July 2022  Hepatitis A and B vaccination status: weakly immune to Hep B  MELD 3.0: 11 at 02/18/2023 10:18 AM MELD-Na: 6 at 02/18/2023 10:18 AM Calculated from: Serum Creatinine: 0.69 mg/dL (Using min of 1 mg/dL) at 7/82/9562 13:08 AM Serum Sodium: 132 mmol/L at 02/18/2023 10:18 AM Total Bilirubin: 0.4 mg/dL (Using min of 1 mg/dL) at 6/57/8469 62:95 AM Serum Albumin: 4.5 g/dL (Using max of 3.5 g/dL) at 2/84/1324 40:10 AM INR(ratio): 1.0 at 02/18/2023 10:18 AM Age at listing (hypothetical): 76 years Sex: Female at 02/18/2023 10:18 AM  GERD -   Constipation -  IDA -    Current Outpatient Medications  Medication Sig Dispense Refill   acetaminophen (TYLENOL) 500 MG tablet Take 1,000 mg by mouth as needed for moderate pain or headache.     amLODipine (NORVASC) 5 MG tablet Take 5 mg by mouth 2 (two) times daily.     Cholecalciferol (D3 5000) 125 MCG (5000  UT) capsule Take 5,000 Units by mouth daily.     docusate sodium (COLACE) 100 MG capsule Take 100 mg by mouth daily.     doxazosin (CARDURA) 2 MG tablet Take 2 mg by mouth at bedtime.     Ferrous Sulfate (IRON) 325 (65 Fe) MG TABS Take by mouth every other day.     levothyroxine (SYNTHROID, LEVOTHROID) 25 MCG tablet Take 25 mcg by mouth every morning. Take 1 a day mon-Saturday and 2 on Sundays  6   lisinopril (ZESTRIL) 40 MG tablet Take 1 tablet (40 mg total) by mouth every morning. 90 tablet 2   loratadine (CLARITIN) 10 MG tablet Take 10 mg by mouth every morning.      pantoprazole (PROTONIX) 40 MG tablet TAKE 1 TABLET BY MOUTH ONCE OR TWICE DAILY BEFORE MEALS FOR ACID REFLUX 60 tablet 5   rosuvastatin (CRESTOR) 10 MG tablet Take 10 mg by mouth daily.     No current facility-administered medications for this visit.    Past Medical History:  Diagnosis Date   Acid reflux    Anxiety    Hypertension    Thyroid disease    Hypothyroid    Past Surgical History:  Procedure Laterality Date   BIOPSY  05/02/2020   Procedure: BIOPSY;  Surgeon: Corbin Ade, MD;  [facility];  Service: Endoscopy;;  gastric   CATARACT EXTRACTION     ESOPHAGOGASTRODUODENOSCOPY N/A 05/02/2020   Procedure: ESOPHAGOGASTRODUODENOSCOPY (EGD);  Surgeon: Corbin Ade, MD;  Mild erosive reflux esophagitis, benign-appearing gastric polyps but otherwise normal-appearing stomach s/p polypectomy and gastric mucosal biopsy, patent pylorus, normal examined duodenum.  Pathology with fundic gland polyp, mild chronic gastritis, negative for H. pylori.     LAPAROSCOPY  1980s   for endometriosis    perdontal     POLYPECTOMY  05/02/2020   Procedure: POLYPECTOMY;  Surgeon: Corbin Ade, MD;  [facility];  Service: Endoscopy;;  gastric   WISDOM TOOTH EXTRACTION      Family History  Problem Relation Age of Onset   Emphysema Mother    Diabetes Father    Hypertension Father    Stroke Father    Colon cancer Neg Hx    Stomach cancer Neg Hx    Esophageal cancer Neg Hx    Pancreatic cancer Neg Hx    Breast cancer Neg Hx    Liver disease Neg Hx     Allergies as of 06/08/2023 - Review Complete 04/04/2023  Allergen Reaction Noted   Codeine Nausea Only 06/21/2018   Tetracyclines & related Other (See Comments) 06/21/2018    Social History   Socioeconomic History   Marital status: Widowed    Spouse name: Not on file   Number of children: Not on file   Years of education: Not on file   Highest education level: Not on file  Occupational History   Not on file   Tobacco Use   Smoking status: Every Day    Packs/day: 1    Types: Cigarettes   Smokeless tobacco: Never  Vaping Use   Vaping Use: Never used  Substance and Sexual Activity   Alcohol use: Not Currently   Drug use: Never   Sexual activity: Not Currently    Birth control/protection: None, Post-menopausal  Other Topics Concern   Not on file  Social History Narrative   Not on file   Social Determinants of Health   Financial Resource Strain: Low Risk  (06/29/2021)   Overall Financial  Resource Strain (CARDIA)    Difficulty of Paying Living Expenses: Not hard at all  Food Insecurity: No Food Insecurity (06/29/2021)   Hunger Vital Sign    Worried About Running Out of Food in the Last Year: Never true    Ran Out of Food in the Last Year: Never true  Transportation Needs: No Transportation Needs (06/29/2021)   PRAPARE - Administrator, Civil Service (Medical): No    Lack of Transportation (Non-Medical): No  Physical Activity: Inactive (06/29/2021)   Exercise Vital Sign    Days of Exercise per Week: 0 days    Minutes of Exercise per Session: 0 min  Stress: No Stress Concern Present (06/29/2021)   [employer] of Occupational Health - Occupational Stress Questionnaire    Feeling of Stress : Only a little  Social Connections: Moderately Isolated (06/29/2021)   Social Connection and Isolation Panel [NHANES]    Frequency of Communication with Friends and Family: Twice a week    Frequency of Social Gatherings with Friends and Family: Once a week    Attends Religious Services: Never    Database administrator or Organizations: No    Attends Engineer, structural: Never    Marital Status: Married     Review of Systems   Gen: Denies fever, chills, anorexia. Denies fatigue, weakness, weight loss.  CV: Denies chest pain, palpitations, syncope, peripheral edema, and claudication. Resp: Denies dyspnea at rest, cough, wheezing, coughing up blood, and pleurisy. GI: See  HPI Derm: Denies rash, itching, dry skin Psych: Denies depression, anxiety, memory loss, confusion. No homicidal or suicidal ideation.  Heme: Denies bruising, bleeding, and enlarged lymph nodes.  Physical Exam   There were no vitals taken for this visit.  General:   Alert and oriented. No distress noted. Pleasant and cooperative.  Head:  Normocephalic and atraumatic. Eyes:  Conjuctiva clear without scleral icterus. Mouth:  Oral mucosa pink and moist. Good dentition. No lesions. Lungs:  Clear to auscultation bilaterally. No wheezes, rales, or rhonchi. No distress.  Heart:  S1, S2 present without murmurs appreciated.  Abdomen:  +BS, soft, non-tender and non-distended. No rebound or guarding. No HSM or masses noted. *** Rectal: deferred Msk:  Symmetrical without gross deformities. Normal posture. Extremities:  Without edema. Neurologic:  Alert and  oriented x4 Psych:  Alert and cooperative. Normal mood and affect.  Assessment  Sydney Jones is a 77 y.o. female with a history of cirrhosis, constipation, GERD, IDA, hypothyroidism, anxiety, and IDA presenting today for follow up.  Cirrhosis: Likely early. Is in need of repeat imaging with elastography to further assess. MELD based on labs from March is 11. Normal repeat LFTs in May. Previous workup with normal immunoglobulins and negative AMA. ASMA and ANA have been positive.   Constipation:   GERD:   IDA: History of heme + stool. EGD in 2021 with erosive esophagitis and mild gastritis. No prior colonoscopy due to patients home situation and difficulty with prep. Is on oral iron every other day and most recent labs in March with normal iron panel and normal Hgb.      PLAN   *** RUQ [place] with elastography MELD labs in 6 months     Brooke Bonito, MSN, FNP-BC, AGACNP-BC [facility]

## 2023-06-08 ENCOUNTER — Ambulatory Visit (INDEPENDENT_AMBULATORY_CARE_PROVIDER_SITE_OTHER): Payer: Medicare Other | Admitting: Gastroenterology

## 2023-06-08 ENCOUNTER — Encounter: Payer: Self-pay | Admitting: Gastroenterology

## 2023-06-08 VITALS — BP 122/66 | HR 74 | Temp 97.7°F | Ht 63.5 in | Wt 123.8 lb

## 2023-06-08 DIAGNOSIS — D509 Iron deficiency anemia, unspecified: Secondary | ICD-10-CM

## 2023-06-08 DIAGNOSIS — K5900 Constipation, unspecified: Secondary | ICD-10-CM

## 2023-06-08 DIAGNOSIS — K2100 Gastro-esophageal reflux disease with esophagitis, without bleeding: Secondary | ICD-10-CM | POA: Diagnosis not present

## 2023-06-08 DIAGNOSIS — K7460 Unspecified cirrhosis of liver: Secondary | ICD-10-CM | POA: Diagnosis not present

## 2023-06-08 NOTE — Patient Instructions (Addendum)
Continue your stool softener/laxative combo daily as needed.  You may continue to take this twice daily if no bowel movement in 24 hours.  Is up to 3 times daily.  We will send lab slips to your house for you to have labs completed prior to your next office visit in 6 months.  We will get you scheduled for an ultrasound of your liver and the schedulers will be in contact with you with a date and time.  Continue your iron every other day.  Diet/Lifestyle Recommendations:  High-protein diet from a primarily plant-based diet. Avoid red meat.  No raw or undercooked meat, seafood, or shellfish. Low-fat/cholesterol/carbohydrate diet. Limit sodium to no more than 2000 mg/day including everything that you eat and drink. Recommend at least 30 minutes of aerobic and resistance exercise 3 days/week. Limit Tylenol to 2000 mg daily.   It was a pleasure to see you today. I want to create trusting relationships with patients. If you receive a survey regarding your visit,  I greatly appreciate you taking time to fill this out on paper or through your MyChart. I value your feedback.  Brooke Bonito, MSN, FNP-BC, AGACNP-BC [facility]

## 2023-06-13 ENCOUNTER — Telehealth: Payer: Self-pay | Admitting: Internal Medicine

## 2023-06-13 ENCOUNTER — Other Ambulatory Visit: Payer: Self-pay

## 2023-06-13 MED ORDER — ROSUVASTATIN CALCIUM 10 MG PO TABS: 10.0000 mg | ORAL_TABLET | Freq: Every day | ORAL | 3 refills | Status: DC

## 2023-06-13 NOTE — Telephone Encounter (Signed)
 Refills sent

## 2023-06-13 NOTE — Telephone Encounter (Signed)
Prescription Request  06/13/2023  LOV: 04/04/2023  What is the name of the medication or equipment? rosuvastatin (CRESTOR) 10 MG tablet [[id number]]   Have you contacted your pharmacy to request a refill? Yes   Which pharmacy would you like this sent to?  [facility] [address], [place] - [address] [place] [address] Phone: [phone] Fax: [phone]    Patient notified that their request is being sent to the clinical staff for review and that they should receive a response within 2 business days.   Please advise at Mobile [phone] (mobile)

## 2023-06-21 ENCOUNTER — Ambulatory Visit (HOSPITAL_COMMUNITY)
Admission: RE | Admit: 2023-06-21 | Discharge: 2023-06-21 | Disposition: A | Discharge status: Home / Self Care | Payer: Medicare Other | Source: Ambulatory Visit | Attending: Gastroenterology | Admitting: Gastroenterology

## 2023-06-21 DIAGNOSIS — K7460 Unspecified cirrhosis of liver: Secondary | ICD-10-CM | POA: Diagnosis not present

## 2023-06-29 ENCOUNTER — Encounter: Payer: Self-pay | Admitting: Acute Care

## 2023-06-29 ENCOUNTER — Ambulatory Visit: Payer: Medicare Other | Admitting: Acute Care

## 2023-06-29 DIAGNOSIS — F17210 Nicotine dependence, cigarettes, uncomplicated: Secondary | ICD-10-CM

## 2023-06-29 NOTE — Patient Instructions (Signed)
Thank you for participating in the [facility]. It was our pleasure to meet you today. We will call you with the results of your scan within the next few days. Your scan will be assigned a Lung RADS category score by the physicians reading the scans.  This Lung RADS score determines follow up scanning.  See below for description of categories, and follow up screening recommendations. We will be in touch to schedule your follow up screening annually or based on recommendations of our providers. We will fax a copy of your scan results to your Primary Care Physician, or the physician who referred you to the program, to ensure they have the results. Please call the office if you have any questions or concerns regarding your scanning experience or results.  Our office number is [phone]. Please speak with Abigail Miyamoto, RN. , or  Karlton Lemon RN, They are  our Lung Cancer Screening RN.'s If They are unavailable when you call, Please leave a message on the voice mail. We will return your call at our earliest convenience.This voice mail is monitored several times a day.  Remember, if your scan is normal, we will scan you annually as long as you continue to meet the criteria for the program. (Age 77-80, Current smoker or smoker who has quit within the last 15 years). If you are a smoker, remember, quitting is the single most powerful action that you can take to decrease your risk of lung cancer and other pulmonary, breathing related problems. We know quitting is hard, and we are here to help.  Please let us know if there is anything we can do to help you meet your goal of quitting. If you are a former smoker, Counselling psychologist. We are proud of you! Remain smoke free! Remember you can refer friends or family members through the number above.  We will screen them to make sure they meet criteria for the program. Thank you for helping [place] take better care of you by  participating in Lung Screening.   Lung RADS Categories:  Lung RADS 1: no nodules or definitely non-concerning nodules.  Recommendation is for a repeat annual scan in 12 months.  Lung RADS 2:  nodules that are non-concerning in appearance and behavior with a very low likelihood of becoming an active cancer. Recommendation is for a repeat annual scan in 12 months.  Lung RADS 3: nodules that are probably non-concerning , includes nodules with a low likelihood of becoming an active cancer.  Recommendation is for a 32-month repeat screening scan. Often noted after an upper respiratory illness. We will be in touch to make sure you have no questions, and to schedule your 31-month scan.  Lung RADS 4 A: nodules with concerning findings, recommendation is most often for a follow up scan in 3 months or additional testing based on our provider's assessment of the scan. We will be in touch to make sure you have no questions and to schedule the recommended 3 month follow up scan.  Lung RADS 4 B:  indicates findings that are concerning. We will be in touch with you to schedule additional diagnostic testing based on our provider's  assessment of the scan.  You can receive free nicotine replacement therapy ( patches, gum or mints) by calling 1-800-QUIT NOW. Please call so we can get you on the path to becoming  a non-smoker. I know it is hard, but you can do this!  Other options for assistance in smoking  cessation ( As covered by your insurance benefits)  Hypnosis for smoking cessation  [employer] [phone]  Acupuncture for smoking cessation  [employer] [phone]

## 2023-06-29 NOTE — Progress Notes (Signed)
Virtual Visit via Telephone Note  I connected with Sydney Jones on 06/29/23 at  9:30 AM EDT by telephone and verified that I am speaking with the correct person using two identifiers.  Location: Patient:  At home Provider: [address], [place], [place], [address]    I discussed the limitations, risks, security and privacy concerns of performing an evaluation and management service by telephone and the availability of in person appointments. I also discussed with the patient that there may be a patient responsible charge related to this service. The patient expressed understanding and agreed to proceed.   Shared Decision Making Visit Lung Cancer Screening Program [phone])   Eligibility: Age 77 y.o. Pack Years Smoking History Calculation 58 pack year smoking history (# packs/per year x # years smoked) Recent History of coughing up blood  no Unexplained weight loss? no ( >Than 15 pounds within the last 6 months ) Prior History Lung / other cancer no (Diagnosis within the last 5 years already requiring surveillance chest CT Scans). Smoking Status Current Smoker Former Smokers: Years since quit: NA  Quit Date:  NA  Visit Components: Discussion included one or more decision making aids. yes Discussion included risk/benefits of screening. yes Discussion included potential follow up diagnostic testing for abnormal scans. yes Discussion included meaning and risk of over diagnosis. yes Discussion included meaning and risk of False Positives. yes Discussion included meaning of total radiation exposure. yes  Counseling Included: Importance of adherence to annual lung cancer LDCT screening. yes Impact of comorbidities on ability to participate in the program. yes Ability and willingness to under diagnostic treatment. yes  Smoking Cessation Counseling: Current Smokers:  Discussed importance of smoking cessation. yes Information about tobacco cessation classes and  interventions provided to patient. yes Patient provided with "ticket" for LDCT Scan. yes Symptomatic Patient. no  Counseling NA Diagnosis Code: Tobacco Use Z72.0 Asymptomatic Patient yes  Counseling (Intermediate counseling: > three minutes counseling) K4401 Former Smokers:  Discussed the importance of maintaining cigarette abstinence. yes Diagnosis Code: Personal History of Nicotine Dependence. U27.253 Information about tobacco cessation classes and interventions provided to patient. Yes Patient provided with "ticket" for LDCT Scan. yes Written Order for Lung Cancer Screening with LDCT placed in Epic. Yes (CT Chest Lung Cancer Screening Low Dose W/O CM) [id number] Z12.2-Screening of respiratory organs Z87.891-Personal history of nicotine dependence  I have spent 25 minutes of face to face/ virtual visit   time with  Sydney Jones discussing the risks and benefits of lung cancer screening. We viewed / discussed a power point together that explained in detail the above noted topics. We paused at intervals to allow for questions to be asked and answered to ensure understanding.We discussed that the single most powerful action that she can take to decrease her risk of developing lung cancer is to quit smoking. We discussed whether or not she is ready to commit to setting a quit date. We discussed options for tools to aid in quitting smoking including nicotine replacement therapy, non-nicotine medications, support groups, Quit Smart classes, and behavior modification. We discussed that often times setting smaller, more achievable goals, such as eliminating 1 cigarette a day for a week and then 2 cigarettes a day for a week can be helpful in slowly decreasing the number of cigarettes smoked. This allows for a sense of accomplishment as well as providing a clinical benefit. I provided  her  with smoking cessation  information  with contact information for community resources, classes, free  nicotine replacement  therapy, and access to mobile apps, text messaging, and on-line smoking cessation help. I have also provided  her  the office contact information in the event she needs to contact me, or the screening staff. We discussed the time and location of the scan, and that either Sydney Miyamoto RN, Sydney Lemon, RN  or I will call / send a letter with the results within 24-72 hours of receiving them. The patient verbalized understanding of all of  the above and had no further questions upon leaving the office. They have my contact information in the event they have any further questions.  I spent 3-4 minutes counseling on smoking cessation and the health risks of continued tobacco abuse.  I explained to the patient that there has been a high incidence of coronary artery disease noted on these exams. I explained that this is a non-gated exam therefore degree or severity cannot be determined. This patient is on statin therapy. I have asked the patient to follow-up with their PCP regarding any incidental finding of coronary artery disease and management with diet or medication as their PCP  feels is clinically indicated. The patient verbalized understanding of the above and had no further questions upon completion of the visit.      Sydney Ngo, NP 06/29/2023

## 2023-06-30 ENCOUNTER — Ambulatory Visit (HOSPITAL_COMMUNITY)
Admission: RE | Admit: 2023-06-30 | Discharge: 2023-06-30 | Disposition: A | Discharge status: Home / Self Care | Payer: Medicare Other | Source: Ambulatory Visit | Attending: Acute Care | Admitting: Acute Care

## 2023-06-30 DIAGNOSIS — Z87891 Personal history of nicotine dependence: Secondary | ICD-10-CM | POA: Insufficient documentation

## 2023-06-30 DIAGNOSIS — F17210 Nicotine dependence, cigarettes, uncomplicated: Secondary | ICD-10-CM | POA: Insufficient documentation

## 2023-07-06 ENCOUNTER — Telehealth: Payer: Self-pay | Admitting: Acute Care

## 2023-07-06 NOTE — Telephone Encounter (Signed)
Call report confirmed and noted in [facility] dashboard. Routed to provider for review: IMPRESSION: 1. Lung-RADS 4A, suspicious. Follow up low-dose chest CT without contrast in 3 months (please use the following order, "CT CHEST [facility] NODULE FOLLOW-UP W/O CM") is recommended. Alternatively, PET may be considered when there is a solid component 8mm or larger. 2.  Emphysema (ICD10-J43.9) and Aortic Atherosclerosis (ICD10-170.0)

## 2023-07-06 NOTE — Telephone Encounter (Signed)
Returned call to radiology but unable to speak to anyone. Placed on hold.  Unable to review results for CT call report. Will watch for results to confirm receipt.

## 2023-07-08 ENCOUNTER — Telehealth: Payer: Self-pay | Admitting: Acute Care

## 2023-07-08 DIAGNOSIS — Z87891 Personal history of nicotine dependence: Secondary | ICD-10-CM

## 2023-07-08 DIAGNOSIS — R911 Solitary pulmonary nodule: Secondary | ICD-10-CM

## 2023-07-08 NOTE — Telephone Encounter (Signed)
I have called the patient with the results of her low-dose screening CT.  I explained that her scan was read as a lung RADS 4A, suspicious There is an 8.8 mm nodule in the right upper lobe.  This is patient's first lung cancer screening however she did have a CT of the chest in 2007.  At that time there was 1 right upper lobe pulmonary nodule that measured 4 mm.  At that time a 1 year follow-up was recommended however I do not believe it was done. Patient is in agreement with the recommended 57-month follow-up low-dose screening CT to ensure stability of this nodule.  We did discuss that over the past 16 years the nodule has doubled in size. Plan will be for a follow-up 41-month low-dose CT beginning of November 2024 to reevaluate this 8.8 mm right upper lobe nodule for stability. There was notation of coronary artery calcifications.  Per the radiology report there is moderate atherosclerotic calcification in the wall of the thoracic aorta, enlargement of the pulmonary outflow tract/main pulmonary arteries which is suggestive of pulmonary hypertension.  Patient is on statin therapy per her epic med list.  I will make sure her primary care doctor is aware of the finding.I did discuss this with her and she is in agreement with PCP follow up if Dr. Durwin Nora feels it is clinically indicated. Dr. Durwin Nora, I will route you a copy of the CT report. Please don't hesitate to contact me for any questions of concerns. Thanks so much Sherre Lain, Robynn Pane, 3 month follow up screening scan. Thanks all

## 2023-07-08 NOTE — Telephone Encounter (Signed)
CT results/ follow up plans faxed to PCP. Order placed for 3 month nodule f/u CT.

## 2023-07-25 ENCOUNTER — Telehealth: Payer: Self-pay | Admitting: Internal Medicine

## 2023-07-25 ENCOUNTER — Other Ambulatory Visit: Payer: Self-pay

## 2023-07-25 MED ORDER — DOXAZOSIN MESYLATE 2 MG PO TABS: 2.0000 mg | ORAL_TABLET | Freq: Every day | ORAL | 1 refills | Status: DC

## 2023-07-25 MED ORDER — AMLODIPINE BESYLATE 5 MG PO TABS: 5.0000 mg | ORAL_TABLET | Freq: Two times a day (BID) | ORAL | 3 refills | Status: DC

## 2023-07-25 NOTE — Telephone Encounter (Signed)
Refills sent

## 2023-07-25 NOTE — Telephone Encounter (Signed)
Prescription Request  07/25/2023  LOV: 04/04/2023  What is the name of the medication or equipment? amLODipine (NORVASC) 5 MG tablet [[id number]]  doxazosin (CARDURA) 2 MG tablet [[id number]]    Have you contacted your pharmacy to request a refill? No   Which pharmacy would you like this sent to?  [facility] [address], [place] - [address] [place] [address] Phone: [phone] Fax: [phone]    Patient notified that their request is being sent to the clinical staff for review and that they should receive a response within 2 business days.   Please advise at Mobile [phone] (mobile)

## 2023-08-15 ENCOUNTER — Other Ambulatory Visit: Payer: Self-pay

## 2023-08-15 MED ORDER — LEVOTHYROXINE SODIUM 25 MCG PO TABS: 25.0000 ug | ORAL_TABLET | Freq: Every morning | ORAL | 0 refills | Status: DC

## 2023-08-15 NOTE — Telephone Encounter (Signed)
Patient calling needing a refill on levothyroxine (SYNTHROID, LEVOTHROID) 25 MCG tablet [[id number]] Please advise [facility] in [place] Thank you

## 2023-08-15 NOTE — Telephone Encounter (Signed)
Refills sent to pharmacy. 

## 2023-09-22 ENCOUNTER — Other Ambulatory Visit: Payer: Self-pay | Admitting: Internal Medicine

## 2023-10-04 ENCOUNTER — Ambulatory Visit (HOSPITAL_COMMUNITY)
Admission: RE | Admit: 2023-10-04 | Discharge: 2023-10-04 | Disposition: A | Discharge status: Home / Self Care | Payer: Medicare Other | Source: Ambulatory Visit | Attending: Acute Care | Admitting: Acute Care

## 2023-10-04 DIAGNOSIS — Z87891 Personal history of nicotine dependence: Secondary | ICD-10-CM | POA: Diagnosis not present

## 2023-10-04 DIAGNOSIS — R911 Solitary pulmonary nodule: Secondary | ICD-10-CM | POA: Diagnosis not present

## 2023-10-05 ENCOUNTER — Encounter: Payer: Self-pay | Admitting: Internal Medicine

## 2023-10-05 ENCOUNTER — Ambulatory Visit (INDEPENDENT_AMBULATORY_CARE_PROVIDER_SITE_OTHER): Payer: Medicare Other | Admitting: Internal Medicine

## 2023-10-05 VITALS — BP 123/77 | HR 73 | Ht 63.0 in | Wt 123.0 lb

## 2023-10-05 DIAGNOSIS — E039 Hypothyroidism, unspecified: Secondary | ICD-10-CM

## 2023-10-05 DIAGNOSIS — I10 Essential (primary) hypertension: Secondary | ICD-10-CM

## 2023-10-05 DIAGNOSIS — Z720 Tobacco use: Secondary | ICD-10-CM

## 2023-10-05 DIAGNOSIS — K7460 Unspecified cirrhosis of liver: Secondary | ICD-10-CM

## 2023-10-05 DIAGNOSIS — E785 Hyperlipidemia, unspecified: Secondary | ICD-10-CM | POA: Diagnosis not present

## 2023-10-05 DIAGNOSIS — Z8739 Personal history of other diseases of the musculoskeletal system and connective tissue: Secondary | ICD-10-CM | POA: Diagnosis not present

## 2023-10-05 DIAGNOSIS — R7303 Prediabetes: Secondary | ICD-10-CM | POA: Diagnosis not present

## 2023-10-05 DIAGNOSIS — Z23 Encounter for immunization: Secondary | ICD-10-CM

## 2023-10-05 NOTE — Assessment & Plan Note (Addendum)
Recently evaluated by GI for follow-up.  Appears well compensated.

## 2023-10-05 NOTE — Assessment & Plan Note (Signed)
Lipid panel updated in May.  Total cholesterol 157 and LDL 82.  She remains on rosuvastatin 10 mg daily.  No medication changes are indicated today.

## 2023-10-05 NOTE — Assessment & Plan Note (Signed)
Influenza vaccine administered today.

## 2023-10-05 NOTE — Patient Instructions (Signed)
It was a pleasure to see you today.  Thank you for giving [place] the opportunity to be involved in your care.  Below is a brief recap of your visit and next steps.  We will plan to see you again in 6 months.  Summary No medication changes today Bone density scan ordered Flu shot today Follow up in 6 months

## 2023-10-05 NOTE — Assessment & Plan Note (Signed)
Reports previously undergoing DEXA scan that showed "early signs of osteoporosis".  She is currently on vitamin D supplementation but reports intolerance to calcium supplementation. -Repeat DEXA ordered today

## 2023-10-05 NOTE — Assessment & Plan Note (Signed)
A1c 5.9 on labs from May.  Dietary changes aimed at improving her blood sugar were reviewed today.

## 2023-10-05 NOTE — Assessment & Plan Note (Signed)
TSH mildly elevated when updated in May.  She is asymptomatic currently and remains on levothyroxine 25 mcg daily. -Repeat TFTs at follow-up in 6 months

## 2023-10-05 NOTE — Progress Notes (Signed)
Established Patient Office Visit  Subjective   Patient ID: Sydney Jones, female    DOB: 1946-10-15  Age: 77 y.o. MRN: [id number]  Chief Complaint  Patient presents with   Hypertension    Six month follow up    Sydney Jones returns to care today for routine follow-up.  She was last evaluated by me on 5/6 as a new patient presenting to establish care.  No medication change from at that time, basic labs were ordered, she was referred for lung cancer screening, and 32-month follow-up was arranged.  In the interim, she has been evaluated by gastroenterology and underwent lung cancer screening.  There have otherwise been no acute interval events.  Sydney Jones reports feeling well today.  She is asymptomatic and has no acute concerns to discuss.  Past Medical History:  Diagnosis Date   Acid reflux    Anxiety    Hypertension    Thyroid disease    Hypothyroid   Past Surgical History:  Procedure Laterality Date   BIOPSY  05/02/2020   Procedure: BIOPSY;  Surgeon: Corbin Ade, MD;  [facility]: [facility];  Service: Endoscopy;;  gastric   CATARACT EXTRACTION     ESOPHAGOGASTRODUODENOSCOPY N/A 05/02/2020   Procedure: ESOPHAGOGASTRODUODENOSCOPY (EGD);  Surgeon: Corbin Ade, MD;  Mild erosive reflux esophagitis, benign-appearing gastric polyps but otherwise normal-appearing stomach s/p polypectomy and gastric mucosal biopsy, patent pylorus, normal examined duodenum.  Pathology with fundic gland polyp, mild chronic gastritis, negative for H. pylori.     LAPAROSCOPY  1980s   for endometriosis    perdontal     POLYPECTOMY  05/02/2020   Procedure: POLYPECTOMY;  Surgeon: Corbin Ade, MD;  Location: [facility];  Service: Endoscopy;;  gastric   WISDOM TOOTH EXTRACTION     Social History   Tobacco Use   Smoking status: Former    Current packs/day: 1.00    Average packs/day: 1 pack/day for 58.3 years (58.3 ttl pk-yrs)    Types: Cigarettes    Start date: 06/28/1965   Smokeless tobacco:  Never  Vaping Use   Vaping status: Never Used  Substance Use Topics   Alcohol use: Not Currently   Drug use: Never   Family History  Problem Relation Age of Onset   Emphysema Mother    Diabetes Father    Hypertension Father    Stroke Father    Colon cancer Neg Hx    Stomach cancer Neg Hx    Esophageal cancer Neg Hx    Pancreatic cancer Neg Hx    Breast cancer Neg Hx    Liver disease Neg Hx    Allergies  Allergen Reactions   Codeine Nausea Only   Tetracyclines & Related Other (See Comments)    Throat and stomach irritation (burning sensation)   Review of Systems  Constitutional:  Negative for chills and fever.  HENT:  Negative for sore throat.   Respiratory:  Negative for cough and shortness of breath.   Cardiovascular:  Negative for chest pain, palpitations and leg swelling.  Gastrointestinal:  Negative for abdominal pain, blood in stool, constipation, diarrhea, nausea and vomiting.  Genitourinary:  Negative for dysuria and hematuria.  Musculoskeletal:  Negative for myalgias.  Skin:  Negative for itching and rash.  Neurological:  Negative for dizziness and headaches.  Psychiatric/Behavioral:  Negative for depression and suicidal ideas.      Objective:     BP 123/77 (BP Location: Left Arm, Patient Position: Sitting, Cuff Size: Normal)  Pulse 73   Ht 5\' 3"  (1.6 m)   Wt 123 lb (55.8 kg)   SpO2 95%   BMI 21.79 kg/m  BP Readings from Last 3 Encounters:  10/05/23 123/77  06/08/23 122/66  04/04/23 132/64   Physical Exam Vitals reviewed.  Constitutional:      General: She is not in acute distress.    Appearance: Normal appearance. She is not toxic-appearing.  HENT:     Head: Normocephalic and atraumatic.     Right Ear: External ear normal.     Left Ear: External ear normal.     Nose: Nose normal. No congestion or rhinorrhea.     Mouth/Throat:     Mouth: Mucous membranes are moist.     Pharynx: Oropharynx is clear. No oropharyngeal exudate or posterior  oropharyngeal erythema.  Eyes:     General: No scleral icterus.    Extraocular Movements: Extraocular movements intact.     Conjunctiva/sclera: Conjunctivae normal.     Pupils: Pupils are equal, round, and reactive to light.  Cardiovascular:     Rate and Rhythm: Normal rate and regular rhythm.     Pulses: Normal pulses.     Heart sounds: Murmur heard.     No friction rub. No gallop.  Pulmonary:     Effort: Pulmonary effort is normal.     Breath sounds: Normal breath sounds. No wheezing, rhonchi or rales.  Abdominal:     General: Abdomen is flat. Bowel sounds are normal. There is no distension.     Palpations: Abdomen is soft.     Tenderness: There is no abdominal tenderness.  Musculoskeletal:        General: No swelling. Normal range of motion.     Cervical back: Normal range of motion.     Right lower leg: No edema.     Left lower leg: No edema.  Lymphadenopathy:     Cervical: No cervical adenopathy.  Skin:    General: Skin is warm and dry.     Capillary Refill: Capillary refill takes less than 2 seconds.     Coloration: Skin is not jaundiced.  Neurological:     General: No focal deficit present.     Mental Status: She is alert and oriented to person, place, and time.  Psychiatric:        Mood and Affect: Mood normal.        Behavior: Behavior normal.   Last CBC Lab Results  Component Value Date   WBC 9.4 04/04/2023   HGB 13.9 04/04/2023   HCT 41.5 04/04/2023   MCV 91 04/04/2023   MCH 30.5 04/04/2023   RDW 14.1 04/04/2023   PLT 298 04/04/2023   Last metabolic panel Lab Results  Component Value Date   GLUCOSE 93 04/04/2023   NA 132 (L) 04/04/2023   K 5.0 04/04/2023   CL 93 (L) 04/04/2023   CO2 23 04/04/2023   BUN 15 04/04/2023   CREATININE 0.68 04/04/2023   EGFR 90 04/04/2023   CALCIUM 10.2 04/04/2023   PROT 7.4 04/04/2023   ALBUMIN 4.6 04/04/2023   LABGLOB 2.8 04/04/2023   AGRATIO 1.6 04/04/2023   BILITOT 0.4 04/04/2023   ALKPHOS 67 04/04/2023   AST  13 04/04/2023   ALT 15 04/04/2023   ANIONGAP 9 05/19/2022   Last lipids Lab Results  Component Value Date   CHOL 157 04/04/2023   HDL 63 04/04/2023   LDLCALC 82 04/04/2023   TRIG 60 04/04/2023   CHOLHDL 2.5 04/04/2023  Last hemoglobin A1c Lab Results  Component Value Date   HGBA1C 5.9 (H) 04/04/2023   Last thyroid functions Lab Results  Component Value Date   TSH 4.890 (H) 04/04/2023   Last vitamin D Lab Results  Component Value Date   VD25OH 45.7 04/04/2023   Last vitamin B12 and Folate Lab Results  Component Value Date   VITAMINB12 611 04/04/2023   FOLATE 5.0 04/04/2023   The 10-year ASCVD risk score (Arnett DK, et al., 2019) is: 32.2%    Assessment & Plan:   Problem List Items Addressed This Visit       Essential hypertension    Remains adequately controlled on current antihypertensive regimen.  No medication changes are indicated today.      Cirrhosis of liver (HCC)    Recently evaluated by GI for follow-up.  Appears well compensated.      Hypothyroidism    TSH mildly elevated when updated in May.  She is asymptomatic currently and remains on levothyroxine 25 mcg daily. -Repeat TFTs at follow-up in 6 months      Hyperlipidemia    Lipid panel updated in May.  Total cholesterol 157 and LDL 82.  She remains on rosuvastatin 10 mg daily.  No medication changes are indicated today.      Current tobacco use    She continues to smoke 1 pack/day of cigarettes and remains precontemplative with regards to cessation.  She is currently enrolled in lung cancer screening.      Prediabetes    A1c 5.9 on labs from May.  Dietary changes aimed at improving her blood sugar were reviewed today.      History of osteopenia - Primary    Reports previously undergoing DEXA scan that showed "early signs of osteoporosis".  She is currently on vitamin D supplementation but reports intolerance to calcium supplementation. -Repeat DEXA ordered today      Need for  influenza vaccination    Influenza vaccine administered today      Return in about 6 months (around 04/03/2024) for CPE.   Billie Lade, MD

## 2023-10-05 NOTE — Assessment & Plan Note (Signed)
She continues to smoke 1 pack/day of cigarettes and remains precontemplative with regards to cessation.  She is currently enrolled in lung cancer screening.

## 2023-10-05 NOTE — Assessment & Plan Note (Signed)
 Remains adequately controlled on current antihypertensive regimen.  No medication changes are indicated today.

## 2023-10-13 ENCOUNTER — Other Ambulatory Visit (HOSPITAL_COMMUNITY): Payer: Medicare Other

## 2023-10-20 ENCOUNTER — Other Ambulatory Visit: Payer: Self-pay | Admitting: Internal Medicine

## 2023-11-03 ENCOUNTER — Encounter: Payer: Self-pay | Admitting: Gastroenterology

## 2023-11-10 ENCOUNTER — Other Ambulatory Visit: Payer: Self-pay | Admitting: Gastroenterology

## 2023-11-10 DIAGNOSIS — K2100 Gastro-esophageal reflux disease with esophagitis, without bleeding: Secondary | ICD-10-CM

## 2023-11-21 ENCOUNTER — Other Ambulatory Visit: Payer: Self-pay | Admitting: Internal Medicine

## 2023-12-01 ENCOUNTER — Other Ambulatory Visit: Payer: Self-pay

## 2023-12-01 ENCOUNTER — Emergency Department (HOSPITAL_COMMUNITY): Payer: Medicare Other

## 2023-12-01 ENCOUNTER — Encounter (HOSPITAL_COMMUNITY): Payer: Self-pay | Admitting: Emergency Medicine

## 2023-12-01 ENCOUNTER — Emergency Department (HOSPITAL_COMMUNITY)
Admission: EM | Admit: 2023-12-01 | Discharge: 2023-12-01 | Disposition: A | Discharge status: Home / Self Care | Payer: Medicare Other | Attending: Emergency Medicine | Admitting: Emergency Medicine

## 2023-12-01 DIAGNOSIS — I4891 Unspecified atrial fibrillation: Secondary | ICD-10-CM | POA: Insufficient documentation

## 2023-12-01 DIAGNOSIS — E871 Hypo-osmolality and hyponatremia: Secondary | ICD-10-CM | POA: Insufficient documentation

## 2023-12-01 DIAGNOSIS — R079 Chest pain, unspecified: Secondary | ICD-10-CM | POA: Diagnosis not present

## 2023-12-01 DIAGNOSIS — J449 Chronic obstructive pulmonary disease, unspecified: Secondary | ICD-10-CM | POA: Insufficient documentation

## 2023-12-01 DIAGNOSIS — I700 Atherosclerosis of aorta: Secondary | ICD-10-CM | POA: Diagnosis not present

## 2023-12-01 DIAGNOSIS — Z79899 Other long term (current) drug therapy: Secondary | ICD-10-CM | POA: Diagnosis not present

## 2023-12-01 DIAGNOSIS — I10 Essential (primary) hypertension: Secondary | ICD-10-CM | POA: Diagnosis not present

## 2023-12-01 DIAGNOSIS — E039 Hypothyroidism, unspecified: Secondary | ICD-10-CM | POA: Diagnosis not present

## 2023-12-01 DIAGNOSIS — F17200 Nicotine dependence, unspecified, uncomplicated: Secondary | ICD-10-CM | POA: Insufficient documentation

## 2023-12-01 DIAGNOSIS — R002 Palpitations: Secondary | ICD-10-CM

## 2023-12-01 DIAGNOSIS — R918 Other nonspecific abnormal finding of lung field: Secondary | ICD-10-CM | POA: Diagnosis not present

## 2023-12-01 DIAGNOSIS — Z79890 Hormone replacement therapy (postmenopausal): Secondary | ICD-10-CM | POA: Diagnosis not present

## 2023-12-01 HISTORY — DX: Chronic obstructive pulmonary disease, unspecified: J44.9

## 2023-12-01 LAB — CBC
HCT: 43.8 % (ref 36.0–46.0)
Hemoglobin: 15 g/dL (ref 12.0–15.0)
MCH: 29.9 pg (ref 26.0–34.0)
MCHC: 34.2 g/dL (ref 30.0–36.0)
MCV: 87.4 fL (ref 80.0–100.0)
Platelets: 277 10*3/uL (ref 150–400)
RBC: 5.01 MIL/uL (ref 3.87–5.11)
RDW: 13.4 % (ref 11.5–15.5)
WBC: 9.3 10*3/uL (ref 4.0–10.5)
nRBC: 0 % (ref 0.0–0.2)

## 2023-12-01 LAB — BASIC METABOLIC PANEL
Anion gap: 10 (ref 5–15)
BUN: 12 mg/dL (ref 8–23)
CO2: 24 mmol/L (ref 22–32)
Calcium: 9.6 mg/dL (ref 8.9–10.3)
Chloride: 91 mmol/L — ABNORMAL LOW (ref 98–111)
Creatinine, Ser: 0.49 mg/dL (ref 0.44–1.00)
GFR, Estimated: >60 mL/min (ref >60)
Glucose, Bld: 111 mg/dL — ABNORMAL HIGH (ref 70–99)
Potassium: 4 mmol/L (ref 3.5–5.1)
Sodium: 125 mmol/L — ABNORMAL LOW (ref 135–145)

## 2023-12-01 LAB — TROPONIN I (HIGH SENSITIVITY)
Troponin I (High Sensitivity): 11 ng/L (ref <18)
Troponin I (High Sensitivity): 13 ng/L (ref <18)

## 2023-12-01 LAB — HEPATIC FUNCTION PANEL
ALT: 17 U/L (ref 0–44)
AST: 16 U/L (ref 15–41)
Albumin: 4 g/dL (ref 3.5–5.0)
Alkaline Phosphatase: 51 U/L (ref 38–126)
Bilirubin, Direct: 0.1 mg/dL (ref 0.0–0.2)
Indirect Bilirubin: 0.5 mg/dL (ref 0.3–0.9)
Total Bilirubin: 0.6 mg/dL (ref 0.0–1.2)
Total Protein: 8.2 g/dL — ABNORMAL HIGH (ref 6.5–8.1)

## 2023-12-01 LAB — TSH: TSH: 6.052 u[IU]/mL — ABNORMAL HIGH (ref 0.350–4.500)

## 2023-12-01 LAB — LIPASE, BLOOD: Lipase: 32 U/L (ref 11–51)

## 2023-12-01 LAB — PROTIME-INR
INR: 1 (ref 0.8–1.2)
Prothrombin Time: 13.7 s (ref 11.4–15.2)

## 2023-12-01 LAB — MAGNESIUM: Magnesium: 1.7 mg/dL (ref 1.7–2.4)

## 2023-12-01 MED ORDER — AMLODIPINE BESYLATE 5 MG PO TABS
5.0000 mg | ORAL_TABLET | Freq: Once | ORAL | Status: AC
  Administered 2023-12-01: 5 mg via ORAL
  Filled 2023-12-01: qty 1

## 2023-12-01 MED ORDER — SODIUM CHLORIDE 0.9 % IV BOLUS
1000.0000 mL | Freq: Once | INTRAVENOUS | Status: AC
Start: 2023-12-01 — End: 2023-12-01
  Administered 2023-12-01: 1000 mL via INTRAVENOUS

## 2023-12-01 NOTE — ED Provider Notes (Addendum)
 [facility] AT [facility] Provider Note   CSN: [id number] Arrival date & time: 12/01/23  1342     History  Chief Complaint  Patient presents with   Palpitations    Sydney Jones is a 78 y.o. female.  Patient experienced palpitations for about an hour when stopped but then had some chest tightness.  That is resolved as well.  Associated with some nausea slight headache.  Patient has had a history of these palpitation feelings before but they have been brief.  No formal evaluation.  Patient felt like maybe she was going to pass out with this 1.  Patient not formally followed by cardiology.  Patient currently completely asymptomatic heart monitor now seems to be consistent with sinus rhythm.  Initial EKG that was done seem to be consistent with atrial fibs.  Past medical history significant for hypertension thyroid  disease COPD.  Patient is an active smoker.  Patient's had a past history questionable for cirrhosis.  As per gastroenterology.       Home Medications Prior to Admission medications   Medication Sig Start Date End Date Taking? Authorizing Provider  acetaminophen (TYLENOL) 500 MG tablet Take 1,000 mg by mouth at bedtime.   Yes [provider]  amLODipine  (NORVASC ) 5 MG tablet Take 1 tablet (5 mg total) by mouth 2 (two) times daily. 07/25/23  Yes Melvenia Manus BRAVO, MD  Cholecalciferol (D3 5000) 125 MCG (5000 UT) capsule Take 5,000 Units by mouth daily.   Yes [provider]  docusate sodium (COLACE) 100 MG capsule Take 100 mg by mouth daily.   Yes [provider]  doxazosin  (CARDURA ) 2 MG tablet TAKE 1 TABLET BY MOUTH AT BEDTIME 11/21/23  Yes Dixon, Phillip E, MD  Ferrous Sulfate (IRON) 325 (65 Fe) MG TABS Take 1 tablet by mouth every other day.   Yes [provider]  levothyroxine  (SYNTHROID ) 25 MCG tablet Take 1 tablet (25 mcg total) by mouth every morning. Take 1 a day mon-Saturday and 2 on Sundays 08/15/23   Yes Melvenia Manus BRAVO, MD  lisinopril  (ZESTRIL ) 40 MG tablet Take 1 tablet (40 mg total) by mouth every morning. 04/04/23  Yes Dixon, Phillip E, MD  loratadine (CLARITIN) 10 MG tablet Take 10 mg by mouth every morning.   Yes [provider]  pantoprazole  (PROTONIX ) 40 MG tablet TAKE 1 TABLET BY MOUTH ONCE OR TWICE DAILY BEFORE MEAL(S) FOR  ACID  REFLUX. Patient taking differently: Take 40 mg by mouth 2 (two) times daily before a meal. 11/10/23  Yes Mahon, Courtney L, NP  rosuvastatin  (CRESTOR ) 10 MG tablet Take 1 tablet (10 mg total) by mouth daily. 06/13/23  Yes Melvenia Manus BRAVO, MD      Allergies    Codeine, Tetracyclines & related, and Valium [diazepam]    Review of Systems   Review of Systems  Constitutional:  Negative for chills and fever.  HENT:  Negative for ear pain and sore throat.   Eyes:  Negative for pain and visual disturbance.  Respiratory:  Negative for cough and shortness of breath.   Cardiovascular:  Positive for chest pain. Negative for palpitations.  Gastrointestinal:  Positive for constipation and nausea. Negative for abdominal pain and vomiting.  Genitourinary:  Negative for dysuria and hematuria.  Musculoskeletal:  Negative for arthralgias and back pain.  Skin:  Negative for color change and rash.  Neurological:  Positive for light-headedness. Negative for seizures and syncope.  All other systems reviewed and are negative.  Physical Exam Updated Vital Signs BP (!) 148/79   Pulse 73   Temp 98.6 F (37 C) (Oral)   Resp 13   SpO2 (!) 89%  Physical Exam Vitals and nursing note reviewed.  Constitutional:      General: She is not in acute distress.    Appearance: Normal appearance. She is well-developed.  HENT:     Head: Normocephalic and atraumatic.  Eyes:     Extraocular Movements: Extraocular movements intact.     Conjunctiva/sclera: Conjunctivae normal.     Pupils: Pupils are equal, round, and reactive to light.  Cardiovascular:     Rate and  Rhythm: Normal rate and regular rhythm.     Heart sounds: No murmur heard. Pulmonary:     Effort: Pulmonary effort is normal. No respiratory distress.     Breath sounds: Normal breath sounds. No wheezing.  Abdominal:     Palpations: Abdomen is soft.     Tenderness: There is no abdominal tenderness.  Musculoskeletal:        General: No swelling.     Cervical back: Neck supple.     Right lower leg: No edema.     Left lower leg: No edema.  Skin:    General: Skin is warm and dry.     Capillary Refill: Capillary refill takes less than 2 seconds.     Coloration: Skin is not jaundiced.  Neurological:     General: No focal deficit present.     Mental Status: She is alert and oriented to person, place, and time.  Psychiatric:        Mood and Affect: Mood normal.     ED Results / Procedures / Treatments   Labs (all labs ordered are listed, but only abnormal results are displayed) Labs Reviewed  BASIC METABOLIC PANEL - Abnormal; Notable for the following components:      Result Value   Sodium 125 (*)    Chloride 91 (*)    Glucose, Bld 111 (*)    All other components within normal limits  TSH - Abnormal; Notable for the following components:   TSH 6.052 (*)    All other components within normal limits  HEPATIC FUNCTION PANEL - Abnormal; Notable for the following components:   Total Protein 8.2 (*)    All other components within normal limits  CBC  MAGNESIUM  LIPASE, BLOOD  PROTIME-INR  TROPONIN I (HIGH SENSITIVITY)  TROPONIN I (HIGH SENSITIVITY)    EKG EKG Interpretation Date/Time:  Thursday December 01 2023 15:50:01 EST Ventricular Rate:  72 PR Interval:  148 QRS Duration:  96 QT Interval:  375 QTC Calculation: 411 R Axis:   85  Text Interpretation: Sinus rhythm Biatrial enlargement Borderline right axis deviation LVH with secondary repolarization abnormality now in sinus rhythm Confirmed by Janeice Stegall [phone]) on 12/01/2023 4:29:53 PM  Radiology DG Chest 2  View Result Date: 12/01/2023 CLINICAL DATA:  Chest pain EXAM: CHEST - 2 VIEW COMPARISON:  10/04/2023 CT FINDINGS: Cardiac shadow is within normal limits. Aortic calcifications are noted. Mild hyperinflation is seen. Mild interstitial changes are noted consistent with the emphysematous changes. No focal infiltrate or effusion is seen. No bony abnormality is noted. IMPRESSION: Emphysematous change without acute abnormality. Electronically Signed   By: Oneil Devonshire M.D.   On: 12/01/2023 17:38    Procedures Procedures    Medications Ordered in ED Medications  sodium chloride  0.9 % bolus 1,000 mL (has no administration in time range)  amLODipine  (NORVASC ) tablet 5  mg (5 mg Oral Given 12/01/23 1603)    ED Course/ Medical Decision Making/ A&P                                 Medical Decision Making Amount and/or Complexity of Data Reviewed Labs: ordered. Radiology: ordered.  Risk Prescription drug management.   Patient now completely asymptomatic.  Heart monitor showing sinus rhythm.  Will repeat EKG.  Patient's labs CBC white count 9.3 hemoglobin 15 platelets 277 basic metabolic panels pending.  Have added on hepatic panel and lipase because of her history of cirrhosis.  Initial troponin is still pending thyroid -stimulating hormone is pending magnesium is pending.  2 view chest x-ray also pending.   Surprisingly the patient's sodium is low at 125 chloride 91 potassium normal at 4.0 renal function normal.  CBC is normal troponins x 2 both normal.  Patient's thyroid -stimulating hormone markedly elevated at 6.052 so she is a little bit hypothyroid.  Magnesium normal chest x-ray emphysematous changes without any acute abnormalities.  Based on this we will give a liter of normal saline and then patient should be stable for discharge home with follow-up with cardiology unless she goes back and atrial fibs.  Patient tolerated the IV fluids without any difficulties no further atrial fibrillation.   She has now been in normal sinus for several hours.  Stable for discharge home precautions provided follow-up with cardiology follow-up with her primary care doctor to have her sodium rechecked.  And to have her thyroid  function checked.  Final Clinical Impression(s) / ED Diagnoses Final diagnoses:  Heart palpitations  Atrial fibrillation, unspecified type (HCC)  Hyponatremia  Hypothyroidism, unspecified type    Rx / DC Orders ED Discharge Orders     None         Geraldene Hamilton, MD 12/01/23 1546    Geraldene Hamilton, MD 12/01/23 8180    Geraldene Hamilton, MD 12/01/23 2001

## 2023-12-01 NOTE — ED Triage Notes (Signed)
 Pt a/o. Pt c/o "heart palpitations" for about an hour then stopped but now having tightness in chest and weak all over. C/o nausea. C/o "slight" headache. Non diaphoretic. nad

## 2023-12-01 NOTE — Discharge Instructions (Signed)
 Follow-up with cardiology follow-up with your regular doctor.  Call cardiology tomorrow to set up an appointment.  Information provided above.  If your heart goes fast and last for 40 minutes or longer need get seen again.  Follow-up with your regular doctor to have your sodium rechecked.

## 2023-12-06 ENCOUNTER — Ambulatory Visit: Payer: Self-pay | Admitting: Internal Medicine

## 2023-12-06 ENCOUNTER — Other Ambulatory Visit: Payer: Self-pay

## 2023-12-06 ENCOUNTER — Ambulatory Visit
Admission: RE | Admit: 2023-12-06 | Discharge: 2023-12-06 | Disposition: A | Discharge status: Home / Self Care | Payer: Medicare Other | Source: Ambulatory Visit | Attending: Family Medicine | Admitting: Family Medicine

## 2023-12-06 VITALS — BP 106/56 | HR 91 | Temp 97.5°F | Resp 20

## 2023-12-06 DIAGNOSIS — R002 Palpitations: Secondary | ICD-10-CM

## 2023-12-06 DIAGNOSIS — F419 Anxiety disorder, unspecified: Secondary | ICD-10-CM | POA: Diagnosis not present

## 2023-12-06 DIAGNOSIS — F32A Depression, unspecified: Secondary | ICD-10-CM

## 2023-12-06 LAB — POCT FASTING CBG KUC MANUAL ENTRY: POCT Glucose (KUC): 140 mg/dL — AB (ref 70–99)

## 2023-12-06 NOTE — Discharge Instructions (Signed)
 As we discussed, follow-up as soon as possible with your primary care provider for further evaluation and resources.  Go to the emergency department if your symptoms worsen at any time.

## 2023-12-06 NOTE — Telephone Encounter (Signed)
 Chief Complaint: anxiety  Symptoms: crying, palpitations, anxiety  Frequency: quite a few months, worsened since Thursday.  Pertinent Negatives: Patient denies nausea, vomiting, diarrhea, thoughts to harm self or others, chest pain, SOB, sweating  Disposition: [] ED /[x] Urgent Care (no appt availability in office) / [] Appointment(In office/virtual)/ []  [facility]/ [] Home Care/ [] Refused Recommended Disposition /[] [facility]/ []  Follow-up with PCP Additional Notes: Patient c/o anxiety and states she was evaluated in ED last Thursday for symptoms. Patient tearful at times, seems improved after talking to RN. Patient states she has suffered from anxiety in the past but has not talked to her PCP about or been diagnosed/medicated for it. Patient denies any suicidal or homicidal ideation. Patient states she does drink 8+ cups of coffee daily. No available appointments today with PCP office, patient requesting to go to urgent care. Patient scheduled for appointment at urgent care.  Copied from CRM [id number]. Topic: Clinical - Pink Word Triage >> Dec 06, 2023  1:17 PM Suzette B wrote: Reason for Triage: emotionally out of control, crying uncontrollable, lost dont know what to do Reason for Disposition  Patient sounds very upset or troubled to the triager  Answer Assessment - Initial Assessment Questions 1. CONCERN: Did anything happen that prompted you to call today?      Patient states last Thursday she started having heart palpitations and she had her son take her to the ER. She states she calmed down once she arrived at the ER. She states she worries about her son and is concerned he doesn't have a support system within her family.  2. ANXIETY SYMPTOMS: Can you describe how you (your loved one; patient) have been feeling? (e.g., tense, restless, panicky, anxious, keyed up, overwhelmed, sense of impending doom).      Feel out of control, the edge of tears, feeling  nervous and unable to concentrate, not feeling calm, no interests, lightheaded  3. ONSET: How long have you been feeling this way? (e.g., hours, days, weeks)     Patient states quite a few months.  4. SEVERITY: How would you rate the level of anxiety? (e.g., 0 - 10; or mild, moderate, severe).     Moderate to severe.  5. FUNCTIONAL IMPAIRMENT: How have these feelings affected your ability to do daily activities? Have you had more difficulty than usual doing your normal daily activities? (e.g., getting better, same, worse; self-care, school, work, interactions)     Patient states she has been able to perform her daily activities but does not have interest to leave the house. Patient states she has been able to sleep at night.   6. HISTORY: Have you felt this way before? Have you ever been diagnosed with an anxiety problem in the past? (e.g., generalized anxiety disorder, panic attacks, PTSD). If Yes, ask: How was this problem treated? (e.g., medicines, counseling, etc.)     Patient denies any prior diagnosis. Patient states she feels like since COVID she has been struggling with wanting to spend time with people, states she doesn't mind talking on the phone but has no desire to see any family.  7. RISK OF HARM - SUICIDAL IDEATION: Do you ever have thoughts of hurting or killing yourself? If Yes, ask:  Do you have these feelings now? Do you have a plan on how you would do this?     Patient denies any thoughts or plans to harm herself or others.  8. TREATMENT:  What has been done so far to treat this  anxiety? (e.g., medicines, relaxation strategies). What has helped?     No, because I keep things to myself  9. TREATMENT - THERAPIST: Do you have a counselor or therapist? Name?     Patient states she has never tried it before but she feels open to seeing a veterinary surgeon.  10. POTENTIAL TRIGGERS: Do you drink caffeinated beverages (e.g., coffee, colas, teas), and how much  daily? Do you drink alcohol or use any drugs? Have you started any new medicines recently?       Patient denies alcohol use; states she drinks 8-12 cups of coffee. Patient denies any new medications.  11. PATIENT SUPPORT: Who is with you now? Who do you live with? Do you have family or friends who you can talk to?        Patient states her son lives with her but he has stepped out to run some errands.  12. OTHER SYMPTOMS: Do you have any other symptoms? (e.g., feeling depressed, trouble concentrating, trouble sleeping, trouble breathing, palpitations or fast heartbeat, chest pain, sweating, nausea, or diarrhea)       Shakiness,trouble concentrating, intermittent palpitations, stomach ache.  Protocols used: Anxiety and Panic Attack-A-AH

## 2023-12-06 NOTE — ED Triage Notes (Addendum)
 Pt reports I don't feel well in the top of my head and all over reports generalized shakiness x3 days. Reports was seen for something similar in the ED but reports palpitations have resolved and appetite has improved. Pt reports  I eat because I know I need to.

## 2023-12-06 NOTE — ED Provider Notes (Signed)
 [facility]    CSN: [id number] Arrival date & time: 12/06/23  1430      History   Chief Complaint Chief Complaint  Patient presents with   Anxiety    Entered by patient    HPI Sydney Jones is a 78 y.o. female.   Patient presenting today with progressively worsening anxiety and depression symptoms.  States she has a lot of stress in her life currently and has a hard time expressing it and feels like it is all building up inside of her and trying to come out.  She states she has had crying spells, feels out of control of her emotions and has been feeling little interest or pleasure in doing anything or being around others lately.  Denies chest pain, shortness of breath, dizziness, recent medication or supplement changes.  She was seen in the emergency department on 12/01/2023 and was evaluated for palpitations, similar symptoms and was found only to have low sodium but otherwise workup was reassuring so was discharged home.  States she has a PCP appointment next week to follow-up on this emergency department visit, called the nurse triage line this morning to discuss worsening anxiety and depression symptoms and was offered to appointments this afternoon but patient did not feel like she could drive that far so she came here instead.  Denies suicidal or homicidal ideation at this time.  She denies any past treatment for anxiety or depression.  Still having occasional palpitations but she is relating them to panic attacks.  Does have a past medical history of anxiety, COPD, hypertension, hypothyroidism.    Past Medical History:  Diagnosis Date   Acid reflux    Anxiety    COPD (chronic obstructive pulmonary disease) (HCC)    Hypertension    Thyroid  disease    Hypothyroid    Patient Active Problem List   Diagnosis Date Noted   Prediabetes 10/05/2023   History of osteopenia 10/05/2023   Need for influenza vaccination 10/05/2023   Essential hypertension 04/04/2023    Hyperlipidemia 04/04/2023   Hypothyroidism 04/04/2023   COPD (chronic obstructive pulmonary disease) (HCC) 04/04/2023   Current tobacco use 04/04/2023   Encounter for general adult medical examination with abnormal findings 04/04/2023   Cirrhosis of liver (HCC) 10/07/2021   Bilateral ovarian cysts 06/29/2021   PMB (postmenopausal bleeding) 06/29/2021   Vaginal atrophy 06/29/2021   Abnormal vaginal bleeding 06/08/2021   Gastroesophageal reflux disease 06/16/2020   Iron deficiency anemia 06/16/2020   Low iron stores 06/16/2020   Abdominal pain, epigastric 04/04/2020   Positive occult stool blood test 04/04/2020   Constipation 04/04/2020    Past Surgical History:  Procedure Laterality Date   BIOPSY  05/02/2020   Procedure: BIOPSY;  Surgeon: Shaaron Lamar HERO, MD;  Location: [facility];  Service: Endoscopy;;  gastric   CATARACT EXTRACTION     ESOPHAGOGASTRODUODENOSCOPY N/A 05/02/2020   Procedure: ESOPHAGOGASTRODUODENOSCOPY (EGD);  Surgeon: Shaaron Lamar HERO, MD;  Mild erosive reflux esophagitis, benign-appearing gastric polyps but otherwise normal-appearing stomach s/p polypectomy and gastric mucosal biopsy, patent pylorus, normal examined duodenum.  Pathology with fundic gland polyp, mild chronic gastritis, negative for H. pylori.     LAPAROSCOPY  1980s   for endometriosis    perdontal     POLYPECTOMY  05/02/2020   Procedure: POLYPECTOMY;  Surgeon: Shaaron Lamar HERO, MD;  Location: [facility];  Service: Endoscopy;;  gastric   WISDOM TOOTH EXTRACTION      OB History  Gravida  1   Para  1   Term  1   Preterm      AB      Living  1      SAB      IAB      Ectopic      Multiple      Live Births               Home Medications    Prior to Admission medications   Medication Sig Start Date End Date Taking? Authorizing Provider  acetaminophen (TYLENOL) 500 MG tablet Take 1,000 mg by mouth at bedtime.    [provider]  amLODipine  (NORVASC ) 5 MG tablet  Take 1 tablet (5 mg total) by mouth 2 (two) times daily. 07/25/23   Melvenia Manus BRAVO, MD  Cholecalciferol (D3 5000) 125 MCG (5000 UT) capsule Take 5,000 Units by mouth daily.    [provider]  docusate sodium (COLACE) 100 MG capsule Take 100 mg by mouth daily.    [provider]  doxazosin  (CARDURA ) 2 MG tablet TAKE 1 TABLET BY MOUTH AT BEDTIME 11/21/23   Dixon, Phillip E, MD  Ferrous Sulfate (IRON) 325 (65 Fe) MG TABS Take 1 tablet by mouth every other day.    [provider]  levothyroxine  (SYNTHROID ) 25 MCG tablet Take 1 tablet (25 mcg total) by mouth every morning. Take 1 a day mon-Saturday and 2 on Sundays 08/15/23   Melvenia Manus BRAVO, MD  lisinopril  (ZESTRIL ) 40 MG tablet Take 1 tablet (40 mg total) by mouth every morning. 04/04/23   Dixon, Phillip E, MD  loratadine (CLARITIN) 10 MG tablet Take 10 mg by mouth every morning.    [provider]  pantoprazole  (PROTONIX ) 40 MG tablet TAKE 1 TABLET BY MOUTH ONCE OR TWICE DAILY BEFORE MEAL(S) FOR  ACID  REFLUX. Patient taking differently: Take 40 mg by mouth 2 (two) times daily before a meal. 11/10/23   Mahon, Charmaine CROME, NP  rosuvastatin  (CRESTOR ) 10 MG tablet Take 1 tablet (10 mg total) by mouth daily. 06/13/23   Melvenia Manus BRAVO, MD    Family History Family History  Problem Relation Age of Onset   Emphysema Mother    Diabetes Father    Hypertension Father    Stroke Father    Colon cancer Neg Hx    Stomach cancer Neg Hx    Esophageal cancer Neg Hx    Pancreatic cancer Neg Hx    Breast cancer Neg Hx    Liver disease Neg Hx     Social History Social History   Tobacco Use   Smoking status: Former    Current packs/day: 1.00    Average packs/day: 1 pack/day for 58.4 years (58.4 ttl pk-yrs)    Types: Cigarettes    Start date: 06/28/1965   Smokeless tobacco: Never  Vaping Use   Vaping status: Never Used  Substance Use Topics   Alcohol use: Not Currently   Drug use: Never     Allergies    Codeine, Tetracyclines & related, and Valium [diazepam]   Review of Systems Review of Systems Per HPI  Physical Exam Triage Vital Signs ED Triage Vitals  Encounter Vitals Group     BP 12/06/23 1451 (!) 106/56     Systolic BP Percentile --      Diastolic BP Percentile --      Pulse Rate 12/06/23 1451 91     Resp 12/06/23 1451 20     Temp  12/06/23 1451 (!) 97.5 F (36.4 C)     Temp Source 12/06/23 1451 Oral     SpO2 12/06/23 1451 91 %     Weight --      Height --      Head Circumference --      Peak Flow --      Pain Score 12/06/23 1450 4     Pain Loc --      Pain Education --      Exclude from Growth Chart --    No data found.  Updated Vital Signs BP (!) 106/56 (BP Location: Right Arm)   Pulse 91   Temp (!) 97.5 F (36.4 C) (Oral)   Resp 20   SpO2 91%   Visual Acuity Right Eye Distance:   Left Eye Distance:   Bilateral Distance:    Right Eye Near:   Left Eye Near:    Bilateral Near:     Physical Exam Vitals and nursing note reviewed.  Constitutional:      Appearance: Normal appearance. She is not ill-appearing.  HENT:     Head: Atraumatic.     Mouth/Throat:     Mouth: Mucous membranes are moist.  Eyes:     Extraocular Movements: Extraocular movements intact.     Conjunctiva/sclera: Conjunctivae normal.  Cardiovascular:     Rate and Rhythm: Normal rate and regular rhythm.     Heart sounds: Normal heart sounds.  Pulmonary:     Effort: Pulmonary effort is normal.  Musculoskeletal:        General: Normal range of motion.     Cervical back: Normal range of motion and neck supple.  Skin:    General: Skin is warm and dry.  Neurological:     Mental Status: She is alert and oriented to person, place, and time.  Psychiatric:     Comments: Tearful throughout exam      UC Treatments / Results  Labs (all labs ordered are listed, but only abnormal results are displayed) Labs Reviewed  POCT FASTING CBG [facility] MANUAL ENTRY - Abnormal; Notable for the  following components:      Result Value   POCT Glucose ([facility]) 140 (*)    All other components within normal limits    EKG   Radiology No results found.  Procedures Procedures (including critical care time)  Medications Ordered in UC Medications - No data to display  Initial Impression / Assessment and Plan / UC Course  I have reviewed the triage vital signs and the nursing notes.  Pertinent labs & imaging results that were available during my care of the patient were reviewed by me and considered in my medical decision making (see chart for details).     Minimally hypertensive in triage, otherwise vital signs within normal limits.  EKG showing sinus tachycardia at 101 bpm, no new ST or T wave changes.  Exam reassuring today overall but suspect significant anxiety and depression causing her current symptoms.  Offered to repeat her metabolic panel as her sodium was low and needing to be rechecked by PCP but she declines and wishes to wait for PCP at this time.  Discussed at length to follow-up as soon as possible, hopefully tomorrow to discuss counseling, potential medication management and to go to the emergency department for any suicidal homicidal ideation.  Final Clinical Impressions(s) / UC Diagnoses   Final diagnoses:  Anxiousness  Depression, unspecified depression type  Palpitations     Discharge Instructions  As we discussed, follow-up as soon as possible with your primary care provider for further evaluation and resources.  Go to the emergency department if your symptoms worsen at any time.    ED Prescriptions   None    PDMP not reviewed this encounter.   Stuart Vernell Norris, [place] 12/06/23 1919

## 2023-12-08 ENCOUNTER — Ambulatory Visit: Payer: Medicare Other | Admitting: Gastroenterology

## 2023-12-12 ENCOUNTER — Other Ambulatory Visit: Payer: Self-pay | Admitting: Gastroenterology

## 2023-12-12 DIAGNOSIS — K2100 Gastro-esophageal reflux disease with esophagitis, without bleeding: Secondary | ICD-10-CM

## 2023-12-13 ENCOUNTER — Encounter: Payer: Self-pay | Admitting: Internal Medicine

## 2023-12-13 ENCOUNTER — Ambulatory Visit (INDEPENDENT_AMBULATORY_CARE_PROVIDER_SITE_OTHER): Payer: Medicare Other | Admitting: Internal Medicine

## 2023-12-13 VITALS — BP 157/78 | HR 91 | Ht 63.0 in | Wt 122.2 lb

## 2023-12-13 DIAGNOSIS — E039 Hypothyroidism, unspecified: Secondary | ICD-10-CM | POA: Diagnosis not present

## 2023-12-13 DIAGNOSIS — F4001 Agoraphobia with panic disorder: Secondary | ICD-10-CM

## 2023-12-13 DIAGNOSIS — B3731 Acute candidiasis of vulva and vagina: Secondary | ICD-10-CM | POA: Diagnosis not present

## 2023-12-13 DIAGNOSIS — N952 Postmenopausal atrophic vaginitis: Secondary | ICD-10-CM

## 2023-12-13 DIAGNOSIS — E871 Hypo-osmolality and hyponatremia: Secondary | ICD-10-CM | POA: Insufficient documentation

## 2023-12-13 DIAGNOSIS — F411 Generalized anxiety disorder: Secondary | ICD-10-CM | POA: Insufficient documentation

## 2023-12-13 DIAGNOSIS — R002 Palpitations: Secondary | ICD-10-CM | POA: Insufficient documentation

## 2023-12-13 MED ORDER — DOXAZOSIN MESYLATE 2 MG PO TABS: 2.0000 mg | ORAL_TABLET | Freq: Every day | ORAL | 3 refills | Status: DC

## 2023-12-13 MED ORDER — FLUOXETINE HCL 10 MG PO CAPS: 10.0000 mg | ORAL_CAPSULE | Freq: Every day | ORAL | 3 refills | Status: DC

## 2023-12-13 MED ORDER — ESTRADIOL 0.1 MG/GM VA CREA: 1.0000 | TOPICAL_CREAM | Freq: Every day | VAGINAL | 12 refills | Status: DC

## 2023-12-13 MED ORDER — LEVOTHYROXINE SODIUM 25 MCG PO TABS: 25.0000 ug | ORAL_TABLET | Freq: Every morning | ORAL | 3 refills | Status: DC

## 2023-12-13 MED ORDER — FLUCONAZOLE 150 MG PO TABS: 150.0000 mg | ORAL_TABLET | Freq: Once | ORAL | 0 refills | Status: AC

## 2023-12-13 NOTE — Assessment & Plan Note (Signed)
 Na+ 125 on labs from 1/2.  Treated with 1 L fluid bolus.  Repeat BMP ordered today.

## 2023-12-13 NOTE — Assessment & Plan Note (Signed)
 Recent ER presentation 1/7 in the setting of anxiety.  She is tearful during today's encounter when discussing recent stress as well as overwhelming anxiety that she experiences when entering public places.  She does not like to leave her home and also does not like to have family visit because she does not want to interact with others.  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 -Treatment options reviewed.  Through shared decision making, fluoxetine  2 mg daily was started today.  Okay to increase to 20 mg daily after 7 days.  She will return to care if symptoms worsen or fail to improve.

## 2023-12-13 NOTE — Assessment & Plan Note (Signed)
 Her acute concern today is vaginal irritation and itching present since July.  She has been applying triple antibiotic ointment without sustained relief. -Empirically treat for candidal vaginitis with Diflucan  150 mg x 1 dose -Will also start estradiol  vaginal cream for likely vaginal atrophy

## 2023-12-13 NOTE — Assessment & Plan Note (Signed)
 Presenting today for ER follow-up in the setting of recent presentation for heart palpitations.  Initial ECG showed atrial fibrillation that quickly converted to normal sinus rhythm on reassessment.  She has not experienced any additional palpitations since ER discharge.  Regular rate and rhythm detected on exam today.  She was referred to cardiology and has an appointment scheduled for April.

## 2023-12-13 NOTE — Assessment & Plan Note (Signed)
 TSH mildly elevated on recent labs.  She continues to take levothyroxine 25 mcg daily.  Repeat TSH/T4 ordered today.

## 2023-12-13 NOTE — Progress Notes (Signed)
 Established Patient Office Visit  Subjective   Patient ID: Sydney Jones, female    DOB: 06-05-1946  Age: 78 y.o. MRN: [id number]  Chief Complaint  Patient presents with   Follow-up    ER follow up    Anxiety    Patient feels anxious going into places, makes her feel light headed, can't catch her breath, feels like she will pass out   Vaginitis    Possible yeast infection, has has burning and itching around the vaginal opening    Ms. Sydney Jones returns to care today for ER follow-up.  She presented to the emergency department on 1/2 endorsing heart palpitations.  ECG in triage showed atrial fibrillation but quickly converted back to sinus rhythm upon reevaluation.  Labs revealed hyponatremia and mildly elevated TSH.  Ultimately, she was treated with IV fluids and symptoms resolved.  She was discharged with PCP follow-up and was referred to cardiology.  She is scheduled to see cardiology in April.  She then presented to urgent care on 1/7 endorsing anxiety.  Labs and ECG were reassuring.  She was discharged with PCP follow-up.  Today she reports feeling fairly well.  There has been no recurrence of heart palpitations since ER discharge.  Her acute concern today is anxiety.  She describes an overwhelming anxiety when in public places such as a store or the pharmacy.  She does not like leaving her home and also does not like to have family visit because she does not want to talk to anyone.  She is interested in medication options for treatment.  Her additional concern today is vaginal irritation that has been present since July.  She has tried applying triple antibiotic ointment without sustained improvement.  Past Medical History:  Diagnosis Date   Acid reflux    Anxiety    COPD (chronic obstructive pulmonary disease) (HCC)    Hypertension    Thyroid  disease    Hypothyroid   Past Surgical History:  Procedure Laterality Date   BIOPSY  05/02/2020   Procedure: BIOPSY;  Surgeon: Shaaron Lamar HERO, MD;  [facility]: [facility];  Service: Endoscopy;;  gastric   CATARACT EXTRACTION     ESOPHAGOGASTRODUODENOSCOPY N/A 05/02/2020   Procedure: ESOPHAGOGASTRODUODENOSCOPY (EGD);  Surgeon: Shaaron Lamar HERO, MD;  Mild erosive reflux esophagitis, benign-appearing gastric polyps but otherwise normal-appearing stomach s/p polypectomy and gastric mucosal biopsy, patent pylorus, normal examined duodenum.  Pathology with fundic gland polyp, mild chronic gastritis, negative for H. pylori.     LAPAROSCOPY  1980s   for endometriosis    perdontal     POLYPECTOMY  05/02/2020   Procedure: POLYPECTOMY;  Surgeon: Shaaron Lamar HERO, MD;  Location: [facility];  Service: Endoscopy;;  gastric   WISDOM TOOTH EXTRACTION     Social History   Tobacco Use   Smoking status: Former    Current packs/day: 1.00    Average packs/day: 1 pack/day for 58.5 years (58.5 ttl pk-yrs)    Types: Cigarettes    Start date: 06/28/1965   Smokeless tobacco: Never  Vaping Use   Vaping status: Never Used  Substance Use Topics   Alcohol use: Not Currently   Drug use: Never   Family History  Problem Relation Age of Onset   Emphysema Mother    Diabetes Father    Hypertension Father    Stroke Father    Colon cancer Neg Hx    Stomach cancer Neg Hx    Esophageal cancer Neg Hx    Pancreatic  cancer Neg Hx    Breast cancer Neg Hx    Liver disease Neg Hx    Allergies  Allergen Reactions   Codeine Nausea Only and Nausea And Vomiting   Tetracyclines & Related Other (See Comments)    Throat and stomach irritation (burning sensation)   Valium [Diazepam] Other (See Comments)    Made her feel drunk high, caused disorientation and dizziness   Review of Systems  Skin:        Vaginal itching and irritation  Psychiatric/Behavioral:  The patient is nervous/anxious.   All other systems reviewed and are negative.    Objective:     BP (!) 157/78   Pulse 91   Ht 5' 3 (1.6 m)   Wt 122 lb 3.2 oz (55.4 kg)   SpO2 93%   BMI  21.65 kg/m  BP Readings from Last 3 Encounters:  12/13/23 (!) 157/78  12/06/23 (!) 106/56  12/01/23 139/72   Physical Exam Vitals reviewed.  Constitutional:      General: She is not in acute distress.    Appearance: Normal appearance. She is not toxic-appearing.  HENT:     Head: Normocephalic and atraumatic.     Right Ear: External ear normal.     Left Ear: External ear normal.     Nose: Nose normal. No congestion or rhinorrhea.     Mouth/Throat:     Mouth: Mucous membranes are moist.     Pharynx: Oropharynx is clear. No oropharyngeal exudate or posterior oropharyngeal erythema.  Eyes:     General: No scleral icterus.    Extraocular Movements: Extraocular movements intact.     Conjunctiva/sclera: Conjunctivae normal.     Pupils: Pupils are equal, round, and reactive to light.  Cardiovascular:     Rate and Rhythm: Normal rate and regular rhythm.     Pulses: Normal pulses.     Heart sounds: Normal heart sounds. No murmur heard.    No friction rub. No gallop.  Pulmonary:     Effort: Pulmonary effort is normal.     Breath sounds: Normal breath sounds. No wheezing, rhonchi or rales.  Abdominal:     General: Abdomen is flat. Bowel sounds are normal. There is no distension.     Palpations: Abdomen is soft.     Tenderness: There is no abdominal tenderness.  Musculoskeletal:        General: No swelling. Normal range of motion.     Cervical back: Normal range of motion.     Right lower leg: No edema.     Left lower leg: No edema.  Lymphadenopathy:     Cervical: No cervical adenopathy.  Skin:    General: Skin is warm and dry.     Capillary Refill: Capillary refill takes less than 2 seconds.     Coloration: Skin is not jaundiced.  Neurological:     General: No focal deficit present.     Mental Status: She is alert and oriented to person, place, and time.  Psychiatric:     Comments: Tearful during encounter   Last CBC Lab Results  Component Value Date   WBC 9.3 12/01/2023    HGB 15.0 12/01/2023   HCT 43.8 12/01/2023   MCV 87.4 12/01/2023   MCH 29.9 12/01/2023   RDW 13.4 12/01/2023   PLT 277 12/01/2023   Last metabolic panel Lab Results  Component Value Date   GLUCOSE 111 (H) 12/01/2023   NA 125 (L) 12/01/2023   K 4.0 12/01/2023  CL 91 (L) 12/01/2023   CO2 24 12/01/2023   BUN 12 12/01/2023   CREATININE 0.49 12/01/2023   GFRNONAA >60 12/01/2023   CALCIUM  9.6 12/01/2023   PROT 8.2 (H) 12/01/2023   ALBUMIN 4.0 12/01/2023   LABGLOB 2.8 04/04/2023   AGRATIO 1.6 04/04/2023   BILITOT 0.6 12/01/2023   ALKPHOS 51 12/01/2023   AST 16 12/01/2023   ALT 17 12/01/2023   ANIONGAP 10 12/01/2023   Last lipids Lab Results  Component Value Date   CHOL 157 04/04/2023   HDL 63 04/04/2023   LDLCALC 82 04/04/2023   TRIG 60 04/04/2023   CHOLHDL 2.5 04/04/2023   Last hemoglobin A1c Lab Results  Component Value Date   HGBA1C 5.9 (H) 04/04/2023   Last thyroid  functions Lab Results  Component Value Date   TSH 6.052 (H) 12/01/2023   Last vitamin D  Lab Results  Component Value Date   VD25OH 45.7 04/04/2023   Last vitamin B12 and Folate Lab Results  Component Value Date   VITAMINB12 611 04/04/2023   FOLATE 5.0 04/04/2023   The 10-year ASCVD risk score (Arnett DK, et al., 2019) is: 36.4%    Assessment & Plan:   Problem List Items Addressed This Visit       Hypothyroidism - Primary   TSH mildly elevated on recent labs.  She continues to take levothyroxine  25 mcg daily.  Repeat TSH/T4 ordered today.      Vaginal atrophy   Her acute concern today is vaginal irritation and itching present since July.  She has been applying triple antibiotic ointment without sustained relief. -Empirically treat for candidal vaginitis with Diflucan  150 mg x 1 dose -Will also start estradiol  vaginal cream for likely vaginal atrophy      Heart palpitations   Presenting today for ER follow-up in the setting of recent presentation for heart palpitations.  Initial  ECG showed atrial fibrillation that quickly converted to normal sinus rhythm on reassessment.  She has not experienced any additional palpitations since ER discharge.  Regular rate and rhythm detected on exam today.  She was referred to cardiology and has an appointment scheduled for April.      Hyponatremia   Na+ 125 on labs from 1/2.  Treated with 1 L fluid bolus.  Repeat BMP ordered today.      Generalized anxiety disorder   Recent ER presentation 1/7 in the setting of anxiety.  She is tearful during today's encounter when discussing recent stress as well as overwhelming anxiety that she experiences when entering public places.  She does not like to leave her home and also does not like to have family visit because she does not want to interact with others.  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 -Treatment options reviewed.  Through shared decision making, fluoxetine  2 mg daily was started today.  Okay to increase to 20 mg daily after 7 days.  She will return to care if symptoms worsen or fail to improve.      Return if symptoms worsen or fail to improve.   Manus FORBES Fireman, MD

## 2023-12-13 NOTE — Patient Instructions (Signed)
 It was a pleasure to see you today.  Thank you for giving us  the opportunity to be involved in your care.  Below is a brief recap of your visit and next steps.  We will plan to see you again in May.  Summary Repeat labs today Start fluoxetine  10 mg daily for anxiety. OK to increase to 20 mg daily after one week. Follow up as scheduled in May

## 2023-12-14 LAB — BASIC METABOLIC PANEL
BUN/Creatinine Ratio: 25 (ref 12–28)
BUN: 14 mg/dL (ref 8–27)
CO2: 24 mmol/L (ref 20–29)
Calcium: 9.6 mg/dL (ref 8.7–10.3)
Chloride: 92 mmol/L — ABNORMAL LOW (ref 96–106)
Creatinine, Ser: 0.55 mg/dL — ABNORMAL LOW (ref 0.57–1.00)
Glucose: 94 mg/dL (ref 70–99)
Potassium: 4.5 mmol/L (ref 3.5–5.2)
Sodium: 131 mmol/L — ABNORMAL LOW (ref 134–144)
eGFR: 94 mL/min/{1.73_m2} (ref >59)

## 2023-12-14 LAB — TSH+FREE T4
Free T4: 1.31 ng/dL (ref 0.82–1.77)
TSH: 4.78 u[IU]/mL — ABNORMAL HIGH (ref 0.450–4.500)

## 2023-12-19 ENCOUNTER — Other Ambulatory Visit: Payer: Self-pay | Admitting: Internal Medicine

## 2023-12-19 DIAGNOSIS — I10 Essential (primary) hypertension: Secondary | ICD-10-CM

## 2024-01-09 ENCOUNTER — Other Ambulatory Visit: Payer: Self-pay | Admitting: Gastroenterology

## 2024-01-09 DIAGNOSIS — K2100 Gastro-esophageal reflux disease with esophagitis, without bleeding: Secondary | ICD-10-CM

## 2024-02-06 ENCOUNTER — Encounter: Payer: Self-pay | Admitting: Gastroenterology

## 2024-02-06 ENCOUNTER — Ambulatory Visit: Payer: Medicare Other | Admitting: Gastroenterology

## 2024-02-06 VITALS — BP 129/66 | HR 76 | Temp 98.6°F | Ht 63.0 in | Wt 121.2 lb

## 2024-02-06 DIAGNOSIS — D509 Iron deficiency anemia, unspecified: Secondary | ICD-10-CM

## 2024-02-06 DIAGNOSIS — K219 Gastro-esophageal reflux disease without esophagitis: Secondary | ICD-10-CM

## 2024-02-06 DIAGNOSIS — K7460 Unspecified cirrhosis of liver: Secondary | ICD-10-CM

## 2024-02-06 DIAGNOSIS — K5900 Constipation, unspecified: Secondary | ICD-10-CM

## 2024-02-06 DIAGNOSIS — K2100 Gastro-esophageal reflux disease with esophagitis, without bleeding: Secondary | ICD-10-CM

## 2024-02-06 MED ORDER — PANTOPRAZOLE SODIUM 40 MG PO TBEC: 40.0000 mg | DELAYED_RELEASE_TABLET | Freq: Two times a day (BID) | ORAL | 3 refills | Status: DC

## 2024-02-06 NOTE — Progress Notes (Signed)
 GI Office Note    Referring Provider: Billie Lade, MD Primary Care Physician:  Billie Lade, MD Primary Gastroenterologist: Gerrit Friends.Rourk, MD  Date:  02/06/2024  ID:  Sydney Jones, DOB 06-28-1946, MRN [id number]   Chief Complaint   Chief Complaint  Patient presents with   Follow-up    Follow up on Cirrhosis and iron. Pt states she has been doing well   History of Present Illness  Sydney Jones is a 78 y.o. female with a history of anxiety/depression, cirrhosis, constipation, GERD, hypothyroidism, and IDA  presenting today for follow up.  Workup 2022: Ultrasound with nodular liver contour with concern for cirrhosis which was also noted on CT scan. K PA 4.2. Bilateral ovarian lesions noted advised to follow-up with gynecology for vaginal bleeding and need for endometrial biopsy. Given financial difficulties after loss of her husband she previously declined colonoscopy due to financial reasons.   Office visit 05/03/2022. GERD controlled with PPI twice daily, denied breakthrough symptoms.  Taking 1 Colace daily and 2 on days she takes iron.  Has a bowel movement that is soft every other day without abdominal pain.  Taking iron every other day.  Denies fatigue, pruritus, mental status changes.  Reported vaginal bleeding had decreased.  Patient reports embarrassment over having no water at home for shower.   Prior workup with positive ANA and mildly elevated ASMA at 31, AMA negative, negative for viral hepatitis, and normal immunoglobulins. Previously normal ferritin and iron panel.  AFP normal in March 2024.  Last office visit 06/08/23.  Denied jaundice, edema, constipation, mental status changes, abdominal pain.  GERD fairly well-controlled without any other upper GI symptoms.  No alarm symptoms in regards to IDA, taking iron daily or every other day.  Noted some occasional lower extremity swelling, will use her TED hose in the wintertime as needed.  Advised ultrasound with  elastography, MELD labs in 6 months.  Continue PPI 1-2 times daily and iron every other day.     Latest Ref Rng & Units 12/13/2023    2:31 PM 12/01/2023    3:47 PM 12/01/2023    2:43 PM  CMP  Glucose 70 - 99 mg/dL 94   295   BUN 8 - 27 mg/dL 14   12   Creatinine 2.84 - 1.00 mg/dL 1.32   4.40   Sodium 102 - 144 mmol/L 131   125   Potassium 3.5 - 5.2 mmol/L 4.5   4.0   Chloride 96 - 106 mmol/L 92   91   CO2 20 - 29 mmol/L 24   24   Calcium 8.7 - 10.3 mg/dL 9.6   9.6   Total Protein 6.5 - 8.1 g/dL  8.2    Total Bilirubin 0.0 - 1.2 mg/dL  0.6    Alkaline Phos 38 - 126 U/L  51    AST 15 - 41 U/L  16    ALT 0 - 44 U/L  17     CBC    Component Value Date/Time   WBC 9.3 12/01/2023 1443   RBC 5.01 12/01/2023 1443   HGB 15.0 12/01/2023 1443   HGB 13.9 04/04/2023 1024   HCT 43.8 12/01/2023 1443   HCT 41.5 04/04/2023 1024   PLT 277 12/01/2023 1443   PLT 298 04/04/2023 1024   MCV 87.4 12/01/2023 1443   MCV 91 04/04/2023 1024   MCH 29.9 12/01/2023 1443   MCHC 34.2 12/01/2023 1443   RDW 13.4 12/01/2023 1443  RDW 14.1 04/04/2023 1024   LYMPHSABS 1.2 04/04/2023 1024   MONOABS 0.6 10/20/2010 1420   EOSABS 0.1 04/04/2023 1024   BASOSABS 0.2 04/04/2023 1024     Today:  Denies constipation.   Jaundice: none Pruritus: none # BM daily: may go 5 days in a row then may skip a day, sometimes goes twice a day.  HE: none Ascites: none  Variceal Bleeding: none Hepatoma Screening: currently due for [place] Edema: none Last EGD: June 2021 - Mild erosive reflux esophagitis. Benign- appearing gastric polyps? status post polypectomy. Status post gastric mucosal biopsy - Normal duodenal bulb and second portion of the duodenum.  GERD: Tacking pantoprazole twice daily and it is controlling her symptoms well. No breakthrough.. No dysphagia, N/V.  Melena/BRBRP: None, may be dark because of iron.   Had the one day of palpitations and then felt weak and all her energy was gone so that is what prompted  her visit. ED thought Afib and then by the time she got hooked up to the monitor she was good and in NSR. Cardiology next month.   Denies dizziness, lightheadedness, syncope, presyncope, chest pain, or shortness of breath.  MELD 3.0: 17 at 12/01/2023  3:47 PM MELD-Na: 6 at 12/01/2023  3:47 PM Calculated from: Serum Creatinine: 0.49 mg/dL (Using min of 1 mg/dL) at 12/04/1094  0:45 PM Serum Sodium: 125 mmol/L at 12/01/2023  2:43 PM Total Bilirubin: 0.6 mg/dL (Using min of 1 mg/dL) at 4/0/9811  9:14 PM Serum Albumin: 4 g/dL (Using max of 3.5 g/dL) at 06/06/2955  2:13 PM INR(ratio): 1 at 12/01/2023  3:47 PM Age at listing (hypothetical): 48 years Sex: Female at 12/01/2023  3:47 PM    Wt Readings from Last 3 Encounters:  02/06/24 121 lb 3.2 oz (55 kg)  12/13/23 122 lb 3.2 oz (55.4 kg)  10/05/23 123 lb (55.8 kg)    Current Outpatient Medications  Medication Sig Dispense Refill   amLODipine (NORVASC) 5 MG tablet Take 1 tablet (5 mg total) by mouth 2 (two) times daily. 180 tablet 3   doxazosin (CARDURA) 2 MG tablet Take 1 tablet (2 mg total) by mouth at bedtime. 90 tablet 3   estradiol (ESTRACE VAGINAL) 0.1 MG/GM vaginal cream Place 1 Applicatorful vaginally at bedtime. 42.5 g 12   FLUoxetine (PROZAC) 10 MG capsule Take 1 capsule (10 mg total) by mouth daily. 90 capsule 3   levothyroxine (SYNTHROID) 25 MCG tablet Take 1 tablet (25 mcg total) by mouth every morning. Take 1 a day mon-Saturday and 2 on Sundays 90 tablet 3   lisinopril (ZESTRIL) 40 MG tablet TAKE 1 TABLET BY MOUTH ONCE DAILY IN THE MORNING 90 tablet 0   pantoprazole (PROTONIX) 40 MG tablet TAKE 1 TABLET BY MOUTH ONCE TO TWICE DAILY BEFORE MEAL(S) FOR  ACID  REFLUX 60 tablet 0   rosuvastatin (CRESTOR) 10 MG tablet Take 1 tablet (10 mg total) by mouth daily. 90 tablet 3   acetaminophen (TYLENOL) 500 MG tablet Take 1,000 mg by mouth at bedtime.     Cholecalciferol (D3 5000) 125 MCG (5000 UT) capsule Take 5,000 Units by mouth daily.      docusate sodium (COLACE) 100 MG capsule Take 100 mg by mouth daily.     Ferrous Sulfate (IRON) 325 (65 Fe) MG TABS Take 1 tablet by mouth every other day.     loratadine (CLARITIN) 10 MG tablet Take 10 mg by mouth every morning.     No current facility-administered medications for this visit.  Past Medical History:  Diagnosis Date   Acid reflux    Anxiety    COPD (chronic obstructive pulmonary disease) (HCC)    Hypertension    Thyroid disease    Hypothyroid    Past Surgical History:  Procedure Laterality Date   BIOPSY  05/02/2020   Procedure: BIOPSY;  Surgeon: Corbin Ade, MD;  [facility]: [facility];  Service: Endoscopy;;  gastric   CATARACT EXTRACTION     ESOPHAGOGASTRODUODENOSCOPY N/A 05/02/2020   Procedure: ESOPHAGOGASTRODUODENOSCOPY (EGD);  Surgeon: Corbin Ade, MD;  Mild erosive reflux esophagitis, benign-appearing gastric polyps but otherwise normal-appearing stomach s/p polypectomy and gastric mucosal biopsy, patent pylorus, normal examined duodenum.  Pathology with fundic gland polyp, mild chronic gastritis, negative for H. pylori.     LAPAROSCOPY  1980s   for endometriosis    perdontal     POLYPECTOMY  05/02/2020   Procedure: POLYPECTOMY;  Surgeon: Corbin Ade, MD;  [facility];  Service: Endoscopy;;  gastric   WISDOM TOOTH EXTRACTION      Family History  Problem Relation Age of Onset   Emphysema Mother    Diabetes Father    Hypertension Father    Stroke Father    Colon cancer Neg Hx    Stomach cancer Neg Hx    Esophageal cancer Neg Hx    Pancreatic cancer Neg Hx    Breast cancer Neg Hx    Liver disease Neg Hx     Allergies as of 02/06/2024 - Review Complete 02/06/2024  Allergen Reaction Noted   Codeine Nausea Only and Nausea And Vomiting 05/25/2018   Tetracyclines & related Other (See Comments) 06/21/2018   Valium [diazepam] Other (See Comments) 12/01/2023    Social History   Socioeconomic History   Marital status: Widowed     Spouse name: Not on file   Number of children: Not on file   Years of education: Not on file   Highest education level: Not on file  Occupational History   Not on file  Tobacco Use   Smoking status: Former    Current packs/day: 1.00    Average packs/day: 1 pack/day for 58.6 years (58.6 ttl pk-yrs)    Types: Cigarettes    Start date: 06/28/1965   Smokeless tobacco: Never  Vaping Use   Vaping status: Never Used  Substance and Sexual Activity   Alcohol use: Not Currently   Drug use: Never   Sexual activity: Not Currently    Birth control/protection: None, Post-menopausal  Other Topics Concern   Not on file  Social History Narrative   Not on file   Social Drivers of Health   Financial Resource Strain: Low Risk  (06/29/2021)   Overall Financial Resource Strain (CARDIA)    Difficulty of Paying Living Expenses: Not hard at all  Food Insecurity: No Food Insecurity (06/29/2021)   Hunger Vital Sign    Worried About Running Out of Food in the Last Year: Never true    Ran Out of Food in the Last Year: Never true  Transportation Needs: No Transportation Needs (06/29/2021)   PRAPARE - Administrator, Civil Service (Medical): No    Lack of Transportation (Non-Medical): No  Physical Activity: Inactive (06/29/2021)   Exercise Vital Sign    Days of Exercise per Week: 0 days    Minutes of Exercise per Session: 0 min  Stress: No Stress Concern Present (06/29/2021)   [employer] of Occupational Health - Occupational Stress Questionnaire  Feeling of Stress : Only a little  Social Connections: Moderately Isolated (06/29/2021)   Social Connection and Isolation Panel [NHANES]    Frequency of Communication with Friends and Family: Twice a week    Frequency of Social Gatherings with Friends and Family: Once a week    Attends Religious Services: Never    Database administrator or Organizations: No    Attends Engineer, structural: Never    Marital Status: Married     Review  of Systems   Gen: Denies fever, chills, anorexia. Denies fatigue, weakness, weight loss.  CV: Denies chest pain, palpitations, syncope, peripheral edema, and claudication. Resp: Denies dyspnea at rest, cough, wheezing, coughing up blood, and pleurisy. GI: See HPI Derm: Denies rash, itching, dry skin Psych: Denies depression, anxiety, memory loss, confusion. No homicidal or suicidal ideation.  Heme: Denies bruising, bleeding, and enlarged lymph nodes.  Physical Exam   BP 129/66   Pulse 76   Temp 98.6 F (37 C)   Ht 5\' 3"  (1.6 m)   Wt 121 lb 3.2 oz (55 kg)   BMI 21.47 kg/m   General:   Alert and oriented. No distress noted. Pleasant and cooperative.  Head:  Normocephalic and atraumatic. Eyes:  Conjuctiva clear without scleral icterus. Mouth:  Oral mucosa pink and moist. Good dentition. No lesions. Lungs:  Clear to auscultation bilaterally. No wheezes, rales, or rhonchi. No distress.  Heart:  S1, S2 present without murmurs appreciated.  Abdomen:  +BS, soft, non-tender and non-distended. No rebound or guarding. No HSM or masses noted. Rectal: deferred Msk:  Symmetrical without gross deformities. Normal posture. Extremities:  Without edema. Neurologic:  Alert and  oriented x4 Psych:  Alert and cooperative. Normal mood and affect.  Assessment  Sydney Jones is a 78 y.o. female with a history of anxiety/depression, cirrhosis, constipation, GERD, hypothyroidism, and IDA  presenting today for follow up.   IDA: No alarm symptoms at this time.  History of heme positive stool.  Last EGD in 2021 with erosive esophagitis and mild gastritis.  Continues to decline colonoscopy given her home situation.  Continues on iron every other day.  No melena or BRBPR.  Advised today if she changes her mind about colonoscopy to give the office call.  Cirrhosis: Remains well compensated.  Due for hepatoma screening, last ultrasound in July 2024.  No signs of decompensation or portal hypertension at this  time.  Likely remains very early stages.  MELD Na score 6 as of labs from January, MELD 3.0 score revealed to be 17, likely driven by slightly worsening hyponatremia.  No evidence of HE at this time.  Discussed the importance of regular visits and hepatoma screening, currently due for surveillance ultrasound, she would like to wait until July for this along with labs.  GERD: Well-controlled with pantoprazole twice daily.  Denies nausea, vomiting, dysphagia, or odynophagia.  Constipation: Well-controlled with combination stool softener/laxative.  Generally having a bowel movement daily, will occasionally skip a day.  Denies any straining.  Is very diet conscious, tries to get fiber.  Denies any abdominal pain.  PLAN   Continue iron every other day CBC, CMP, INR, AFP in 4 months RUQ [place] with elastography (prefers to do annually) Continue pantoprazole 40 mg BID. Refilled with 90 day supply.  Increase fiber in diet 2 g sodium diet Follow-up 6 months   Brooke Bonito, MSN, FNP-BC, AGACNP-BC [facility]

## 2024-02-06 NOTE — Patient Instructions (Signed)
 We will check labs and get you scheduled for your ultrasound in a few months, we will send you a letter.  Continue iron every other day.  Continue pantoprazole 40 mg twice daily.  I sent in 90-day supply for you with refills.  Cirrhosis Lifestyle Recommendations:  High-protein diet from a primarily plant-based diet. Avoid red meat.  No raw or undercooked meat, seafood, or shellfish. Low-fat/cholesterol/carbohydrate diet. Limit sodium to no more than 2000 mg/day including everything that you eat and drink. Recommend at least 30 minutes of aerobic and resistance exercise 3 days/week. Limit Tylenol to 2000 mg daily.   If at any point you change your mind about trying the colonoscopy, please give me a call.  We will plan to follow-up in 6 months for your regularly scheduled cirrhosis checkup.  It was a pleasure to see you today. I want to create trusting relationships with patients. If you receive a survey regarding your visit,  I greatly appreciate you taking time to fill this out on paper or through your MyChart. I value your feedback.  Brooke Bonito, MSN, FNP-BC, AGACNP-BC [facility]

## 2024-02-07 ENCOUNTER — Ambulatory Visit: Payer: Medicare Other | Admitting: Internal Medicine

## 2024-02-08 ENCOUNTER — Ambulatory Visit (INDEPENDENT_AMBULATORY_CARE_PROVIDER_SITE_OTHER): Payer: Medicare Other

## 2024-02-08 VITALS — BP 129/66 | Ht 64.0 in | Wt 121.0 lb

## 2024-02-08 DIAGNOSIS — Z0100 Encounter for examination of eyes and vision without abnormal findings: Secondary | ICD-10-CM

## 2024-02-08 DIAGNOSIS — Z0000 Encounter for general adult medical examination without abnormal findings: Secondary | ICD-10-CM | POA: Diagnosis not present

## 2024-02-08 DIAGNOSIS — Z780 Asymptomatic menopausal state: Secondary | ICD-10-CM

## 2024-02-08 NOTE — Progress Notes (Signed)
 Because this visit was a virtual/telehealth visit,  certain criteria was not obtained, such a blood pressure, CBG if applicable, and timed get up and go. Any medications not marked as "taking" were not mentioned during the medication reconciliation part of the visit. Any vitals not documented were not able to be obtained due to this being a telehealth visit or patient was unable to self-report a recent blood pressure reading due to a lack of equipment at home via telehealth. Vitals that have been documented are verbally provided by the patient.   Subjective:   Sydney Jones is a 78 y.o. who presents for a Medicare Wellness preventive visit.  Visit Complete: Virtual I connected with  Sydney Jones on 02/08/24 by a audio enabled telemedicine application and verified that I am speaking with the correct person using two identifiers.  Patient Location: Home  Provider Location: Home Office  I discussed the limitations of evaluation and management by telemedicine. The patient expressed understanding and agreed to proceed.  Vital Signs: Because this visit was a virtual/telehealth visit, some criteria may be missing or patient reported. Any vitals not documented were not able to be obtained and vitals that have been documented are patient reported.  VideoDeclined- This patient declined Librarian, academic. Therefore the visit was completed with audio only.  AWV Questionnaire: No: Patient Medicare AWV questionnaire was not completed prior to this visit.  Cardiac Risk Factors include: advanced age (>61men, >27 women);hypertension;sedentary lifestyle;smoking/ tobacco exposure;Other (see comment), Risk factor comments: COPD, pulmonary disease     Objective:    Today's Vitals   02/08/24 1353  BP: 129/66  Weight: 121 lb (54.9 kg)  Height: 5\' 4"  (1.626 m)   Body mass index is 20.77 kg/m.     02/08/2024    2:02 PM 05/02/2020    6:39 AM 09/28/2018    3:10 PM  06/21/2018    3:37 PM  Advanced Directives  Does Patient Have a Medical Advance Directive? Yes No No No  Type of Estate agent of [place];Living will     Does patient want to make changes to medical advance directive? No - Patient declined     Copy of Healthcare Power of Attorney in Chart? Yes - validated most recent copy scanned in chart (See row information)     Would patient like information on creating a medical advance directive?  No - Patient declined No - Patient declined No - Patient declined    Current Medications (verified) Outpatient Encounter Medications as of 02/08/2024  Medication Sig   acetaminophen (TYLENOL) 500 MG tablet Take 1,000 mg by mouth at bedtime.   amLODipine (NORVASC) 5 MG tablet Take 1 tablet (5 mg total) by mouth 2 (two) times daily.   Cholecalciferol (D3 5000) 125 MCG (5000 UT) capsule Take 5,000 Units by mouth daily.   docusate sodium (COLACE) 100 MG capsule Take 100 mg by mouth daily.   doxazosin (CARDURA) 2 MG tablet Take 1 tablet (2 mg total) by mouth at bedtime.   estradiol (ESTRACE VAGINAL) 0.1 MG/GM vaginal cream Place 1 Applicatorful vaginally at bedtime.   Ferrous Sulfate (IRON) 325 (65 Fe) MG TABS Take 1 tablet by mouth every other day.   levothyroxine (SYNTHROID) 25 MCG tablet Take 1 tablet (25 mcg total) by mouth every morning. Take 1 a day mon-Saturday and 2 on Sundays   lisinopril (ZESTRIL) 40 MG tablet TAKE 1 TABLET BY MOUTH ONCE DAILY IN THE MORNING   loratadine (CLARITIN) 10 MG  tablet Take 10 mg by mouth every morning.   pantoprazole (PROTONIX) 40 MG tablet Take 1 tablet (40 mg total) by mouth 2 (two) times daily before a meal.   rosuvastatin (CRESTOR) 10 MG tablet Take 1 tablet (10 mg total) by mouth daily.   FLUoxetine (PROZAC) 10 MG capsule Take 1 capsule (10 mg total) by mouth daily. (Patient not taking: Reported on 02/08/2024)   No facility-administered encounter medications on file as of 02/08/2024.    Allergies  (verified) Codeine, Tetracyclines & related, and Valium [diazepam]   History: Past Medical History:  Diagnosis Date   Acid reflux    Anxiety    COPD (chronic obstructive pulmonary disease) (HCC)    Hypertension    Thyroid disease    Hypothyroid   Past Surgical History:  Procedure Laterality Date   BIOPSY  05/02/2020   Procedure: BIOPSY;  Surgeon: Corbin Ade, MD;  Location: [facility];  Service: Endoscopy;;  gastric   CATARACT EXTRACTION     ESOPHAGOGASTRODUODENOSCOPY N/A 05/02/2020   Procedure: ESOPHAGOGASTRODUODENOSCOPY (EGD);  Surgeon: Corbin Ade, MD;  Mild erosive reflux esophagitis, benign-appearing gastric polyps but otherwise normal-appearing stomach s/p polypectomy and gastric mucosal biopsy, patent pylorus, normal examined duodenum.  Pathology with fundic gland polyp, mild chronic gastritis, negative for H. pylori.     LAPAROSCOPY  1980s   for endometriosis    perdontal     POLYPECTOMY  05/02/2020   Procedure: POLYPECTOMY;  Surgeon: Corbin Ade, MD;  Location: [facility];  Service: Endoscopy;;  gastric   WISDOM TOOTH EXTRACTION     Family History  Problem Relation Age of Onset   Emphysema Mother    Diabetes Father    Hypertension Father    Stroke Father    Colon cancer Neg Hx    Stomach cancer Neg Hx    Esophageal cancer Neg Hx    Pancreatic cancer Neg Hx    Breast cancer Neg Hx    Liver disease Neg Hx    Social History   Socioeconomic History   Marital status: Widowed    Spouse name: Not on file   Number of children: Not on file   Years of education: Not on file   Highest education level: Not on file  Occupational History   Not on file  Tobacco Use   Smoking status: Every Day    Current packs/day: 1.00    Average packs/day: 1 pack/day for 58.6 years (58.6 ttl pk-yrs)    Types: Cigarettes    Start date: 06/28/1965   Smokeless tobacco: Never  Vaping Use   Vaping status: Never Used  Substance and Sexual Activity   Alcohol use: Not  Currently   Drug use: Never   Sexual activity: Not Currently    Birth control/protection: None, Post-menopausal  Other Topics Concern   Not on file  Social History Narrative   Not on file   Social Drivers of Health   Financial Resource Strain: Low Risk  (02/08/2024)   Overall Financial Resource Strain (CARDIA)    Difficulty of Paying Living Expenses: Not hard at all  Food Insecurity: No Food Insecurity (02/08/2024)   Hunger Vital Sign    Worried About Running Out of Food in the Last Year: Never true    Ran Out of Food in the Last Year: Never true  Transportation Needs: No Transportation Needs (02/08/2024)   PRAPARE - Administrator, Civil Service (Medical): No    Lack of Transportation (Non-Medical): No  Physical Activity: Patient Declined (02/08/2024)   Exercise Vital Sign    Days of Exercise per Week: Patient declined    Minutes of Exercise per Session: Patient declined  Stress: No Stress Concern Present (02/08/2024)   [employer] of Occupational Health - Occupational Stress Questionnaire    Feeling of Stress : Not at all  Social Connections: Socially Isolated (02/08/2024)   Social Connection and Isolation Panel [NHANES]    Frequency of Communication with Friends and Family: More than three times a week    Frequency of Social Gatherings with Friends and Family: More than three times a week    Attends Religious Services: Never    Database administrator or Organizations: No    Attends Banker Meetings: Never    Marital Status: Widowed    Tobacco Counseling Ready to quit: Not Answered Counseling given: Yes    Clinical Intake:  Pre-visit preparation completed: Yes  Pain : No/denies pain     BMI - recorded: 20.77 Nutritional Status: BMI of 19-24  Normal Nutritional Risks: None Diabetes: No  How often do you need to have someone help you when you read instructions, pamphlets, or other written materials from your doctor or pharmacy?: 1 -  Never  Interpreter Needed?: No  Information entered by :: A Iverson Sees CMA   Activities of Daily Living     02/08/2024    2:02 PM  In your present state of health, do you have any difficulty performing the following activities:  Hearing? 0  Vision? 0  Difficulty concentrating or making decisions? 0  Walking or climbing stairs? 0  Dressing or bathing? 0  Doing errands, shopping? 0  Preparing Food and eating ? N  Using the Toilet? N  In the past six months, have you accidently leaked urine? N  Do you have problems with loss of bowel control? N  Managing your Medications? N  Managing your Finances? N  Housekeeping or managing your Housekeeping? N    Patient Care Team: Billie Lade, MD as PCP - General (Internal Medicine) Jena Gauss Gerrit Friends, MD as Consulting Physician (Gastroenterology)  Indicate any recent Medical Services you may have received from other than [facility] providers in the past year (date may be approximate).     Assessment:   This is a routine wellness examination for Sydney Jones.  Hearing/Vision screen Hearing Screening - Comments:: Patient denies any hearing difficulties.   Vision Screening - Comments:: Patient is not up to date on yearly eye exams.  She will call [facility] office to schedule a yearly exam    Goals Addressed             This Visit's Progress    Patient Stated       Win the lottery       Depression Screen     02/08/2024    1:55 PM 12/13/2023    1:56 PM 10/05/2023    1:12 PM 04/04/2023    9:20 AM 06/29/2021   10:09 AM  PHQ 2/9 Scores  PHQ - 2 Score 0 0 0 0 0  PHQ- 9 Score 0   0 0    Fall Risk     02/08/2024    2:01 PM 12/13/2023    1:56 PM 10/05/2023    1:12 PM 04/04/2023    9:20 AM 06/29/2021   10:08 AM  Fall Risk   Falls in the past year? 0 0 0 0 0  Number falls in past yr: 0 0  0 0 0  Injury with Fall? 0 0 0 0 0  Risk for fall due to : No Fall Risks No Fall Risks No Fall Risks No Fall Risks No Fall Risks  Follow up Falls  prevention discussed;Falls evaluation completed Falls evaluation completed Falls evaluation completed  Falls evaluation completed    MEDICARE RISK AT HOME:  Medicare Risk at Home Any stairs in or around the home?: Yes If so, are there any without handrails?: No Home free of loose throw rugs in walkways, pet beds, electrical cords, etc?: Yes Adequate lighting in your home to reduce risk of falls?: Yes Life alert?: No Use of a cane, walker or w/c?: No Grab bars in the bathroom?: Yes Shower chair or bench in shower?: Yes Elevated toilet seat or a handicapped toilet?: No  TIMED UP AND GO:  Was the test performed?  No  Cognitive Function: 6CIT completed        02/08/2024    1:58 PM  6CIT Screen  What Year? 0 points  What month? 0 points  What time? 0 points  Count back from 20 0 points  Months in reverse 0 points  Repeat phrase 0 points  Total Score 0 points    Immunizations Immunization History  Administered Date(s) Administered   Fluad Trivalent(High Dose 65+) 10/05/2023    Screening Tests Health Maintenance  Topic Date Due   DTaP/Tdap/Td (1 - Tdap) Never done   Zoster Vaccines- Shingrix (1 of 2) Never done   DEXA SCAN  Never done   COVID-19 Vaccine (1 - 2024-25 season) Never done   Pneumonia Vaccine 110+ Years old (1 of 2 - PCV) 04/13/2024 (Originally 09/04/1952)   Lung Cancer Screening  10/03/2024   Medicare Annual Wellness (AWV)  02/07/2025   INFLUENZA VACCINE  Completed   Hepatitis C Screening  Completed   HPV VACCINES  Aged Out    Health Maintenance  Health Maintenance Due  Topic Date Due   DTaP/Tdap/Td (1 - Tdap) Never done   Zoster Vaccines- Shingrix (1 of 2) Never done   DEXA SCAN  Never done   COVID-19 Vaccine (1 - 2024-25 season) Never done   Health Maintenance Items Addressed: DEXA scheduled, Referral sent to Optometry/Ophthalmology  Additional Screening:  Vision Screening: Recommended annual ophthalmology exams for early detection of  glaucoma and other disorders of the eye.  Dental Screening: Recommended annual dental exams for proper oral hygiene  Community Resource Referral / Chronic Care Management: CRR required this visit?  No   CCM required this visit?  No     Plan:     I have personally reviewed and noted the following in the patient's chart:   Medical and social history Use of alcohol, tobacco or illicit drugs  Current medications and supplements including opioid prescriptions. Patient is not currently taking opioid prescriptions. Functional ability and status Nutritional status Physical activity Advanced directives List of other physicians Hospitalizations, surgeries, and ER visits in previous 12 months Vitals Screenings to include cognitive, depression, and falls Referrals and appointments  In addition, I have reviewed and discussed with patient certain preventive protocols, quality metrics, and best practice recommendations. A written personalized care plan for preventive services as well as general preventive health recommendations were provided to patient.     Jordan Hawks Cane Dubray, CMA   02/08/2024   After Visit Summary: (Mail) Due to this being a telephonic visit, the after visit summary with patients personalized plan was offered to patient via mail   Notes: Nothing  significant to report at this time.

## 2024-02-08 NOTE — Patient Instructions (Addendum)
 Sydney Jones , Thank you for taking time to come for your Medicare Wellness Visit. I appreciate your ongoing commitment to your health goals. Please review the following plan we discussed and let me know if I can assist you in the future.   Referrals/Orders/Follow-Ups/Clinician Recommendations:  Next Medicare Annual Wellness Visit:   February 12, 2025 at 9:20 am telephone  Your bone density appointment is:  February 22, 2025 arrive by 10:45 am Wear two piece comfortable clothes. Take a list of all of your medications. If you are taking any medications containing calcium, stop those 2 days before your scan is scheduled.  This is a list of the screening recommended for you and due dates:  Health Maintenance  Topic Date Due   DTaP/Tdap/Td vaccine (1 - Tdap) Never done   Zoster (Shingles) Vaccine (1 of 2) Never done   DEXA scan (bone density measurement)  Never done   COVID-19 Vaccine (1 - 2024-25 season) Never done   Pneumonia Vaccine (1 of 2 - PCV) 04/13/2024*   Screening for Lung Cancer  10/03/2024   Medicare Annual Wellness Visit  02/07/2025   Flu Shot  Completed   Hepatitis C Screening  Completed   HPV Vaccine  Aged Out  *Topic was postponed. The date shown is not the original due date.    Advanced directives: (In Chart) A copy of your advanced directives are scanned into your chart should your provider ever need it.  Next Medicare Annual Wellness Visit scheduled for next year: yes  Understanding Your Risk for Falls Millions of people have serious injuries from falls each year. It is important to understand your risk of falling. Talk with your health care provider about your risk and what you can do to lower it. If you do have a serious fall, make sure to tell your provider. Falling once raises your risk of falling again. How can falls affect me? Serious injuries from falls are common. These include: Broken bones, such as hip fractures. Head injuries, such as traumatic brain injuries  (TBI) or concussions. A fear of falling can cause you to avoid activities and stay at home. This can make your muscles weaker and raise your risk for a fall. What can increase my risk? There are a number of risk factors that increase your risk for falling. The more risk factors you have, the higher your risk of falling. Serious injuries from a fall happen most often to people who are older than 78 years old. Teenagers and young adults ages 76-29 are also at higher risk. Common risk factors include: Weakness in the lower body. Being generally weak or confused due to long-term (chronic) illness. Dizziness or balance problems. Poor vision. Medicines that cause dizziness or drowsiness. These may include: Medicines for your blood pressure, heart, anxiety, insomnia, or swelling (edema). Pain medicines. Muscle relaxants. Other risk factors include: Drinking alcohol. Having had a fall in the past. Having foot pain or wearing improper footwear. Working at a dangerous job. Having any of the following in your home: Tripping hazards, such as floor clutter or loose rugs. Poor lighting. Pets. Having dementia or memory loss. What actions can I take to lower my risk of falling?     Physical activity Stay physically fit. Do strength and balance exercises. Consider taking a regular class to build strength and balance. Yoga and tai chi are good options. Vision Have your eyes checked every year and your prescription for glasses or contacts updated as needed. Shoes and walking aids  Wear non-skid shoes. Wear shoes that have rubber soles and low heels. Do not wear high heels. Do not walk around the house in socks or slippers. Use a cane or walker as told by your provider. Home safety Attach secure railings on both sides of your stairs. Install grab bars for your bathtub, shower, and toilet. Use a non-skid mat in your bathtub or shower. Attach bath mats securely with double-sided, non-slip rug  tape. Use good lighting in all rooms. Keep a flashlight near your bed. Make sure there is a clear path from your bed to the bathroom. Use night-lights. Do not use throw rugs. Make sure all carpeting is taped or tacked down securely. Remove all clutter from walkways and stairways, including extension cords. Repair uneven or broken steps and floors. Avoid walking on icy or slippery surfaces. Walk on the grass instead of on icy or slick sidewalks. Use ice melter to get rid of ice on walkways in the winter. Use a cordless phone. Questions to ask your health care provider Can you help me check my risk for a fall? Do any of my medicines make me more likely to fall? Should I take a vitamin D supplement? What exercises can I do to improve my strength and balance? Should I make an appointment to have my vision checked? Do I need a bone density test to check for weak bones (osteoporosis)? Would it help to use a cane or a walker? Where to find more information Centers for Disease Control and Prevention, STEADI: TonerPromos.no Community-Based Fall Prevention Programs: TonerPromos.no [employer] on Aging: BaseRingTones.pl Contact a health care provider if: You fall at home. You are afraid of falling at home. You feel weak, drowsy, or dizzy. This information is not intended to replace advice given to you by your health care provider. Make sure you discuss any questions you have with your health care provider. Document Revised: 07/19/2022 Document Reviewed: 07/19/2022 Elsevier Patient Education  2024 [employer].

## 2024-02-23 ENCOUNTER — Inpatient Hospital Stay (HOSPITAL_COMMUNITY): Admission: RE | Admit: 2024-02-23 | Source: Ambulatory Visit

## 2024-03-09 ENCOUNTER — Ambulatory Visit: Payer: Medicare Other | Admitting: Cardiology

## 2024-03-15 ENCOUNTER — Other Ambulatory Visit: Payer: Self-pay | Admitting: Internal Medicine

## 2024-03-15 DIAGNOSIS — I10 Essential (primary) hypertension: Secondary | ICD-10-CM

## 2024-04-05 ENCOUNTER — Encounter: Payer: Self-pay | Admitting: Internal Medicine

## 2024-04-05 ENCOUNTER — Ambulatory Visit (INDEPENDENT_AMBULATORY_CARE_PROVIDER_SITE_OTHER): Payer: Medicare Other | Admitting: Internal Medicine

## 2024-04-05 VITALS — BP 148/70 | HR 86 | Ht 63.5 in | Wt 120.5 lb

## 2024-04-05 DIAGNOSIS — Z8739 Personal history of other diseases of the musculoskeletal system and connective tissue: Secondary | ICD-10-CM | POA: Diagnosis not present

## 2024-04-05 DIAGNOSIS — K7460 Unspecified cirrhosis of liver: Secondary | ICD-10-CM

## 2024-04-05 DIAGNOSIS — J449 Chronic obstructive pulmonary disease, unspecified: Secondary | ICD-10-CM | POA: Diagnosis not present

## 2024-04-05 DIAGNOSIS — E039 Hypothyroidism, unspecified: Secondary | ICD-10-CM | POA: Diagnosis not present

## 2024-04-05 DIAGNOSIS — R002 Palpitations: Secondary | ICD-10-CM

## 2024-04-05 DIAGNOSIS — E559 Vitamin D deficiency, unspecified: Secondary | ICD-10-CM | POA: Diagnosis not present

## 2024-04-05 DIAGNOSIS — Z720 Tobacco use: Secondary | ICD-10-CM

## 2024-04-05 DIAGNOSIS — I10 Essential (primary) hypertension: Secondary | ICD-10-CM

## 2024-04-05 DIAGNOSIS — E871 Hypo-osmolality and hyponatremia: Secondary | ICD-10-CM

## 2024-04-05 DIAGNOSIS — F411 Generalized anxiety disorder: Secondary | ICD-10-CM

## 2024-04-05 DIAGNOSIS — K2100 Gastro-esophageal reflux disease with esophagitis, without bleeding: Secondary | ICD-10-CM

## 2024-04-05 DIAGNOSIS — E785 Hyperlipidemia, unspecified: Secondary | ICD-10-CM

## 2024-04-05 DIAGNOSIS — R7303 Prediabetes: Secondary | ICD-10-CM

## 2024-04-05 MED ORDER — LISINOPRIL 40 MG PO TABS: 40.0000 mg | ORAL_TABLET | Freq: Every morning | ORAL | Status: DC

## 2024-04-05 NOTE — Assessment & Plan Note (Signed)
 Noted on previous labs. -Repeat A1c ordered today

## 2024-04-05 NOTE — Assessment & Plan Note (Signed)
 Previously referred to cardiology for evaluation in the setting of heart palpitations with concern for underlying arrhythmia.  She is not experiencing heart palpitations since ER discharge in January and has decided not to undergo cardiology evaluation.

## 2024-04-05 NOTE — Progress Notes (Signed)
 Established Patient Office Visit  Subjective   Patient ID: FLOWER ABURTO, female    DOB: [birth year]  Age: 78 y.o. MRN: [id number]  Chief Complaint  Patient presents with   Care Management    Six month follow up    Ms. Schlender returns to care today for routine follow-up.  She was last evaluated by me on 1/14 for ER follow-up.  At that time she endorsed overwhelming anxiety when in public places.  Ultimately, fluoxetine  was prescribed in the setting of generalized anxiety disorder with panic attacks.  Repeat labs were ordered and to follow-up as previously scheduled for today was.  She has been seen by gastroenterology for follow-up in the interim.  There have otherwise been no acute interval events.  Ms. Vernice Goodell reports feeling well today.  She is asymptomatic and has no acute concerns to discuss.  She states that she tried taking fluoxetine  for anxiety but experienced migraine headaches within a few days of starting it.  She is not interested in additional treatment options for management of anxiety at this time.  Past Medical History:  Diagnosis Date   Acid reflux    Anxiety    COPD (chronic obstructive pulmonary disease) (HCC)    Hypertension    Thyroid  disease    Hypothyroid   Past Surgical History:  Procedure Laterality Date   BIOPSY  05/02/2020   Procedure: BIOPSY;  Surgeon: Suzette Espy, MD;  Location: [facility];  Service: Endoscopy;;  gastric   CATARACT EXTRACTION     ESOPHAGOGASTRODUODENOSCOPY N/A 05/02/2020   Procedure: ESOPHAGOGASTRODUODENOSCOPY (EGD);  Surgeon: Suzette Espy, MD;  Mild erosive reflux esophagitis, benign-appearing gastric polyps but otherwise normal-appearing stomach s/p polypectomy and gastric mucosal biopsy, patent pylorus, normal examined duodenum.  Pathology with fundic gland polyp, mild chronic gastritis, negative for H. pylori.     LAPAROSCOPY  1980s   for endometriosis    perdontal     POLYPECTOMY  05/02/2020   Procedure: POLYPECTOMY;   Surgeon: Suzette Espy, MD;  Location: [facility];  Service: Endoscopy;;  gastric   WISDOM TOOTH EXTRACTION     Social History   Tobacco Use   Smoking status: Every Day    Current packs/day: 1.00    Average packs/day: 1 pack/day for 58.8 years (58.8 ttl pk-yrs)    Types: Cigarettes    Start date: 06/28/1965   Smokeless tobacco: Never  Vaping Use   Vaping status: Never Used  Substance Use Topics   Alcohol use: Not Currently   Drug use: Never   Family History  Problem Relation Age of Onset   Emphysema Mother    Diabetes Father    Hypertension Father    Stroke Father    Colon cancer Neg Hx    Stomach cancer Neg Hx    Esophageal cancer Neg Hx    Pancreatic cancer Neg Hx    Breast cancer Neg Hx    Liver disease Neg Hx    Allergies  Allergen Reactions   Codeine Nausea Only and Nausea And Vomiting   Tetracyclines & Related Other (See Comments)    Throat and stomach irritation (burning sensation)   Valium [Diazepam] Other (See Comments)    Made her feel "drunk" "high", caused disorientation and dizziness   Review of Systems  Constitutional:  Negative for chills and fever.  HENT:  Negative for sore throat.   Respiratory:  Negative for cough and shortness of breath.   Cardiovascular:  Negative for chest pain, palpitations  and leg swelling.  Gastrointestinal:  Negative for abdominal pain, blood in stool, constipation, diarrhea, nausea and vomiting.  Genitourinary:  Negative for dysuria and hematuria.  Musculoskeletal:  Negative for myalgias.  Skin:  Negative for itching and rash.  Neurological:  Negative for dizziness and headaches.  Psychiatric/Behavioral:  Negative for depression and suicidal ideas.       Objective:     BP (!) 148/70   Pulse 86   Ht 5' 3.5" (1.613 m)   Wt 120 lb 8 oz (54.7 kg)   SpO2 92%   BMI 21.01 kg/m  BP Readings from Last 3 Encounters:  04/05/24 (!) 148/70  02/08/24 129/66  02/06/24 129/66   Physical Exam Vitals reviewed.   Constitutional:      General: She is not in acute distress.    Appearance: Normal appearance. She is not toxic-appearing.  HENT:     Head: Normocephalic and atraumatic.     Right Ear: External ear normal.     Left Ear: External ear normal.     Nose: Nose normal. No congestion or rhinorrhea.     Mouth/Throat:     Mouth: Mucous membranes are moist.     Pharynx: Oropharynx is clear. No oropharyngeal exudate or posterior oropharyngeal erythema.  Eyes:     General: No scleral icterus.    Extraocular Movements: Extraocular movements intact.     Conjunctiva/sclera: Conjunctivae normal.     Pupils: Pupils are equal, round, and reactive to light.  Cardiovascular:     Rate and Rhythm: Normal rate and regular rhythm.     Pulses: Normal pulses.     Heart sounds: Normal heart sounds. No murmur heard.    No friction rub. No gallop.  Pulmonary:     Effort: Pulmonary effort is normal.     Breath sounds: Normal breath sounds. No wheezing, rhonchi or rales.  Abdominal:     General: Abdomen is flat. Bowel sounds are normal. There is no distension.     Palpations: Abdomen is soft.     Tenderness: There is no abdominal tenderness.  Musculoskeletal:        General: No swelling. Normal range of motion.     Cervical back: Normal range of motion.     Right lower leg: No edema.     Left lower leg: No edema.  Lymphadenopathy:     Cervical: No cervical adenopathy.  Skin:    General: Skin is warm and dry.     Capillary Refill: Capillary refill takes less than 2 seconds.     Coloration: Skin is not jaundiced.  Neurological:     General: No focal deficit present.     Mental Status: She is alert and oriented to person, place, and time.  Psychiatric:        Mood and Affect: Mood normal.        Behavior: Behavior normal.   Last CBC Lab Results  Component Value Date   WBC 9.3 12/01/2023   HGB 15.0 12/01/2023   HCT 43.8 12/01/2023   MCV 87.4 12/01/2023   MCH 29.9 12/01/2023   RDW 13.4 12/01/2023    PLT 277 12/01/2023   Last metabolic panel Lab Results  Component Value Date   GLUCOSE 94 12/13/2023   NA 131 (L) 12/13/2023   K 4.5 12/13/2023   CL 92 (L) 12/13/2023   CO2 24 12/13/2023   BUN 14 12/13/2023   CREATININE 0.55 (L) 12/13/2023   EGFR 94 12/13/2023   CALCIUM  9.6 12/13/2023  PROT 8.2 (H) 12/01/2023   ALBUMIN 4.0 12/01/2023   LABGLOB 2.8 04/04/2023   AGRATIO 1.6 04/04/2023   BILITOT 0.6 12/01/2023   ALKPHOS 51 12/01/2023   AST 16 12/01/2023   ALT 17 12/01/2023   ANIONGAP 10 12/01/2023   Last lipids Lab Results  Component Value Date   CHOL 157 04/04/2023   HDL 63 04/04/2023   LDLCALC 82 04/04/2023   TRIG 60 04/04/2023   CHOLHDL 2.5 04/04/2023   Last hemoglobin A1c Lab Results  Component Value Date   HGBA1C 5.9 (H) 04/04/2023   Last thyroid  functions Lab Results  Component Value Date   TSH 4.780 (H) 12/13/2023   Last vitamin D  Lab Results  Component Value Date   VD25OH 45.7 04/04/2023   Last vitamin B12 and Folate Lab Results  Component Value Date   VITAMINB12 611 04/04/2023   FOLATE 5.0 04/04/2023   The 10-year ASCVD risk score (Arnett DK, et al., 2019) is: 43.2%    Assessment & Plan:   Problem List Items Addressed This Visit       Essential hypertension   Remains adequately controlled on current antihypertensive regimen.  No medication changes are indicated today.      Cirrhosis of liver (HCC)   Appears well compensated on exam.  Followed by GI.  Repeat labs ordered today.      Hypothyroidism   She is currently prescribed levothyroxine  25 mcg daily.  Repeat thyroid  studies ordered today.      Hyperlipidemia   She is currently prescribed rosuvastatin  10 mg daily.  Repeat lipid panel ordered today.      Current tobacco use   She continues to smoke 1 pack/day of cigarettes and remains precontemplative with regards to cessation.  Lung cancer screening is up-to-date.      Prediabetes   Noted on previous labs.  Repeat A1c  ordered today.      History of osteopenia   Noted on previous DEXA.  Repeat DEXA ordered in November 2024 but she does not want to proceed with additional imaging.  She remains on daily vitamin D  supplementation.  Previously intolerant of calcium  supplementation.      Heart palpitations   Previously referred to cardiology for evaluation in the setting of heart palpitations with concern for underlying arrhythmia.  She is not experiencing heart palpitations since ER discharge in January and has decided not to undergo cardiology evaluation.      Hyponatremia   Noted on labs from January.  Na+ 130 mL on most recently.  Will repeat labs today.      Generalized anxiety disorder   Seen for ER follow-up in January the setting of anxiety mostly associated with entering public places.  Fluoxetine  was prescribed but she did not tolerate it.  She states that anxiety has improved.  She is not interested in any further treatment for anxiety at this time.       Return in about 6 months (around 10/06/2024).    Tobi Fortes, MD

## 2024-04-05 NOTE — Assessment & Plan Note (Signed)
 She continues to smoke 1 pack/day of cigarettes and remains precontemplative with regards to cessation.  Lung cancer screening is up-to-date.

## 2024-04-05 NOTE — Assessment & Plan Note (Signed)
 She is currently prescribed levothyroxine  25 mcg daily.  Repeat thyroid  studies ordered today.

## 2024-04-05 NOTE — Patient Instructions (Signed)
 It was a pleasure to see you today.  Thank you for giving [place] the opportunity to be involved in your care.  Below is a brief recap of your visit and next steps.  We will plan to see you again in 6 months.  Summary No medication changes today Repeat labs ordered Follow up in 6 months

## 2024-04-05 NOTE — Assessment & Plan Note (Signed)
 Seen for ER follow-up in January the setting of anxiety mostly associated with entering public places.  Fluoxetine  was prescribed but she did not tolerate it.  She states that anxiety has improved.  She is not interested in any further treatment for anxiety at this time.

## 2024-04-05 NOTE — Assessment & Plan Note (Signed)
 Noted on labs from January.  Na+ 130 mL on most recently.  Will repeat labs today.

## 2024-04-05 NOTE — Assessment & Plan Note (Signed)
 Noted on previous DEXA.  Repeat DEXA ordered in November 2024 but she does not want to proceed with additional imaging.  She remains on daily vitamin D  supplementation.  Previously intolerant of calcium  supplementation.

## 2024-04-05 NOTE — Assessment & Plan Note (Signed)
She is currently prescribed rosuvastatin 10 mg daily. -Repeat lipid panel ordered today

## 2024-04-05 NOTE — Assessment & Plan Note (Signed)
 Appears well compensated on exam.  Followed by GI.  Repeat labs ordered today.

## 2024-04-05 NOTE — Assessment & Plan Note (Signed)
 Remains adequately controlled on current antihypertensive regimen.  No medication changes are indicated today.

## 2024-04-06 LAB — B12 AND FOLATE PANEL
Folate: 6.9 ng/mL (ref >3.0)
Vitamin B-12: 492 pg/mL (ref 232–1245)

## 2024-04-06 LAB — CMP14+EGFR
ALT: 12 IU/L (ref 0–32)
AST: 15 IU/L (ref 0–40)
Albumin: 4.2 g/dL (ref 3.8–4.8)
Alkaline Phosphatase: 66 IU/L (ref 44–121)
BUN/Creatinine Ratio: 21 (ref 12–28)
BUN: 15 mg/dL (ref 8–27)
Bilirubin Total: 0.3 mg/dL (ref 0.0–1.2)
CO2: 25 mmol/L (ref 20–29)
Calcium: 9.4 mg/dL (ref 8.7–10.3)
Chloride: 95 mmol/L — ABNORMAL LOW (ref 96–106)
Creatinine, Ser: 0.72 mg/dL (ref 0.57–1.00)
Globulin, Total: 3 g/dL (ref 1.5–4.5)
Glucose: 86 mg/dL (ref 70–99)
Potassium: 4.7 mmol/L (ref 3.5–5.2)
Sodium: 133 mmol/L — ABNORMAL LOW (ref 134–144)
Total Protein: 7.2 g/dL (ref 6.0–8.5)
eGFR: 86 mL/min/{1.73_m2} (ref >59)

## 2024-04-06 LAB — CBC WITH DIFFERENTIAL/PLATELET
Basophils Absolute: 0.1 10*3/uL (ref 0.0–0.2)
Basos: 2 %
EOS (ABSOLUTE): 0.1 10*3/uL (ref 0.0–0.4)
Eos: 1 %
Hematocrit: 39.2 % (ref 34.0–46.6)
Hemoglobin: 12.9 g/dL (ref 11.1–15.9)
Immature Grans (Abs): 0 10*3/uL (ref 0.0–0.1)
Immature Granulocytes: 1 %
Lymphocytes Absolute: 1.2 10*3/uL (ref 0.7–3.1)
Lymphs: 17 %
MCH: 29.9 pg (ref 26.6–33.0)
MCHC: 32.9 g/dL (ref 31.5–35.7)
MCV: 91 fL (ref 79–97)
Monocytes Absolute: 0.5 10*3/uL (ref 0.1–0.9)
Monocytes: 8 %
Neutrophils Absolute: 4.8 10*3/uL (ref 1.4–7.0)
Neutrophils: 71 %
Platelets: 272 10*3/uL (ref 150–450)
RBC: 4.32 x10E6/uL (ref 3.77–5.28)
RDW: 13.5 % (ref 11.7–15.4)
WBC: 6.7 10*3/uL (ref 3.4–10.8)

## 2024-04-06 LAB — LIPID PANEL
Chol/HDL Ratio: 2.3 ratio (ref 0.0–4.4)
Cholesterol, Total: 138 mg/dL (ref 100–199)
HDL: 60 mg/dL (ref >39)
LDL Chol Calc (NIH): 66 mg/dL (ref 0–99)
Triglycerides: 57 mg/dL (ref 0–149)
VLDL Cholesterol Cal: 12 mg/dL (ref 5–40)

## 2024-04-06 LAB — HEMOGLOBIN A1C
Est. average glucose Bld gHb Est-mCnc: 123 mg/dL
Hgb A1c MFr Bld: 5.9 % — ABNORMAL HIGH (ref 4.8–5.6)

## 2024-04-06 LAB — TSH+FREE T4
Free T4: 1.26 ng/dL (ref 0.82–1.77)
TSH: 4.7 u[IU]/mL — ABNORMAL HIGH (ref 0.450–4.500)

## 2024-04-06 LAB — VITAMIN D 25 HYDROXY (VIT D DEFICIENCY, FRACTURES): Vit D, 25-Hydroxy: 55.1 ng/mL (ref 30.0–100.0)

## 2024-04-25 ENCOUNTER — Ambulatory Visit: Payer: Self-pay

## 2024-04-25 NOTE — Telephone Encounter (Signed)
 Chief Complaint: knee pain Symptoms: right knee pain Frequency: x 1 week Pertinent Negatives: Patient denies injury Disposition: [] ED /[x] Urgent Care (no appt availability in office) / [] Appointment(In office/virtual)/ []  [facility]/ [] Home Care/ [] Refused Recommended Disposition /[] [facility]/ []  Follow-up with PCP Additional Notes: Pt states for the last 2 years she has been experiencing her right knee popping  in and out of place, then on Sunday the pain got more intense. States that she can know barely put weight on it. States that this popping is different. States pain is 10/10 when putting weight on it. States that she is currently using a knee brace and wheelchair.   Copied from CRM [id number]. Topic: Clinical - Red Word Triage >> Apr 25, 2024  1:41 PM [place] wrote: Red Word that prompted transfer to Nurse Triage: right knee pain, not able to walk on it, started on Sunday, having to use wheelchair, was hearing a popping when walking before  it got too bad Reason for Disposition . [1] SEVERE pain (e.g., excruciating, unable to walk) AND [2] not improved after 2 hours of pain medicine  Protocols used: Knee Pain-A-AH

## 2024-04-25 NOTE — Telephone Encounter (Signed)
Patient advised to go to Urgent Care

## 2024-04-27 ENCOUNTER — Ambulatory Visit
Admission: RE | Admit: 2024-04-27 | Discharge: 2024-04-27 | Disposition: A | Discharge status: Home / Self Care | Source: Ambulatory Visit | Attending: Family Medicine | Admitting: Family Medicine

## 2024-04-27 VITALS — BP 162/72 | Temp 97.6°F | Resp 16

## 2024-04-27 DIAGNOSIS — M1711 Unilateral primary osteoarthritis, right knee: Secondary | ICD-10-CM

## 2024-04-27 MED ORDER — DICLOFENAC SODIUM 1 % EX GEL: 2.0000 g | Freq: Four times a day (QID) | CUTANEOUS | 1 refills | Status: AC

## 2024-04-27 NOTE — ED Provider Notes (Signed)
 [facility]    CSN: [id number] Arrival date & time: 04/27/24  2725      History   Chief Complaint No chief complaint on file.   HPI Sydney Jones is a 78 y.o. female.   Presenting today with 1 week history of right knee pain, cracking and popping with walking.  Denies swelling, discoloration, injury, numbness, tingling, loss of range of motion.  Known history of osteoarthritis of the right knee.  So far not trying anything over-the-counter for symptoms.    Past Medical History:  Diagnosis Date   Acid reflux    Anxiety    COPD (chronic obstructive pulmonary disease) (HCC)    Hypertension    Thyroid  disease    Hypothyroid    Patient Active Problem List   Diagnosis Date Noted   Heart palpitations 12/13/2023   Hyponatremia 12/13/2023   Generalized anxiety disorder 12/13/2023   Prediabetes 10/05/2023   History of osteopenia 10/05/2023   Need for influenza vaccination 10/05/2023   Essential hypertension 04/04/2023   Hyperlipidemia 04/04/2023   Hypothyroidism 04/04/2023   COPD (chronic obstructive pulmonary disease) (HCC) 04/04/2023   Current tobacco use 04/04/2023   Encounter for general adult medical examination with abnormal findings 04/04/2023   Cirrhosis of liver (HCC) 10/07/2021   Bilateral ovarian cysts 06/29/2021   PMB (postmenopausal bleeding) 06/29/2021   Vaginal atrophy 06/29/2021   Abnormal vaginal bleeding 06/08/2021   Gastroesophageal reflux disease 06/16/2020   Iron deficiency anemia 06/16/2020   Low iron stores 06/16/2020   Abdominal pain, epigastric 04/04/2020   Positive occult stool blood test 04/04/2020   Constipation 04/04/2020    Past Surgical History:  Procedure Laterality Date   BIOPSY  05/02/2020   Procedure: BIOPSY;  Surgeon: Suzette Espy, MD;  Location: [facility];  Service: Endoscopy;;  gastric   CATARACT EXTRACTION     ESOPHAGOGASTRODUODENOSCOPY N/A 05/02/2020   Procedure: ESOPHAGOGASTRODUODENOSCOPY (EGD);  Surgeon:  Suzette Espy, MD;  Mild erosive reflux esophagitis, benign-appearing gastric polyps but otherwise normal-appearing stomach s/p polypectomy and gastric mucosal biopsy, patent pylorus, normal examined duodenum.  Pathology with fundic gland polyp, mild chronic gastritis, negative for H. pylori.     LAPAROSCOPY  1980s   for endometriosis    perdontal     POLYPECTOMY  05/02/2020   Procedure: POLYPECTOMY;  Surgeon: Suzette Espy, MD;  [facility]: [facility];  Service: Endoscopy;;  gastric   WISDOM TOOTH EXTRACTION      OB History     Gravida  1   Para  1   Term  1   Preterm      AB      Living  1      SAB      IAB      Ectopic      Multiple      Live Births               Home Medications    Prior to Admission medications   Medication Sig Start Date End Date Taking? Authorizing Provider  diclofenac  Sodium (VOLTAREN  ARTHRITIS PAIN) 1 % GEL Apply 2 g topically 4 (four) times daily. 04/27/24  Yes Corbin Dess, PA-C  acetaminophen (TYLENOL) 500 MG tablet Take 1,000 mg by mouth at bedtime.    [provider]  amLODipine  (NORVASC ) 5 MG tablet Take 1 tablet (5 mg total) by mouth 2 (two) times daily. 07/25/23   Tobi Fortes, MD  Cholecalciferol (D3 5000) 125 MCG (5000 UT)  capsule Take 5,000 Units by mouth daily.    [provider]  docusate sodium (COLACE) 100 MG capsule Take 100 mg by mouth daily.    [provider]  doxazosin  (CARDURA ) 2 MG tablet Take 1 tablet (2 mg total) by mouth at bedtime. 12/13/23   Tobi Fortes, MD  estradiol  (ESTRACE  VAGINAL) 0.1 MG/GM vaginal cream Place 1 Applicatorful vaginally at bedtime. 12/13/23   Dixon, Phillip E, MD  Ferrous Sulfate (IRON) 325 (65 Fe) MG TABS Take 1 tablet by mouth every other day.    [provider]  levothyroxine  (SYNTHROID ) 25 MCG tablet Take 1 tablet (25 mcg total) by mouth every morning. Take 1 a day mon-Saturday and 2 on Sundays 12/13/23   Tobi Fortes, MD   lisinopril  (ZESTRIL ) 40 MG tablet Take 1 tablet (40 mg total) by mouth every morning. 04/05/24   Dixon, Phillip E, MD  loratadine (CLARITIN) 10 MG tablet Take 10 mg by mouth every morning.    [provider]  pantoprazole  (PROTONIX ) 40 MG tablet Take 1 tablet (40 mg total) by mouth 2 (two) times daily before a meal. 02/06/24   Mahon, Martine Sleek, NP  rosuvastatin  (CRESTOR ) 10 MG tablet Take 1 tablet (10 mg total) by mouth daily. 06/13/23   Tobi Fortes, MD    Family History Family History  Problem Relation Age of Onset   Emphysema Mother    Diabetes Father    Hypertension Father    Stroke Father    Colon cancer Neg Hx    Stomach cancer Neg Hx    Esophageal cancer Neg Hx    Pancreatic cancer Neg Hx    Breast cancer Neg Hx    Liver disease Neg Hx     Social History Social History   Tobacco Use   Smoking status: Every Day    Current packs/day: 1.00    Average packs/day: 1 pack/day for 58.8 years (58.8 ttl pk-yrs)    Types: Cigarettes    Start date: 06/28/1965   Smokeless tobacco: Never  Vaping Use   Vaping status: Never Used  Substance Use Topics   Alcohol use: Not Currently   Drug use: Never     Allergies   Codeine, Prozac  [fluoxetine ], Tetracyclines & related, and Valium [diazepam]   Review of Systems Review of Systems PER HPI  Physical Exam Triage Vital Signs ED Triage Vitals  Encounter Vitals Group     BP 04/27/24 1002 (!) 162/72     Systolic BP Percentile --      Diastolic BP Percentile --      Pulse --      Resp 04/27/24 1002 16     Temp 04/27/24 1002 97.6 F (36.4 C)     Temp Source 04/27/24 1002 Oral     SpO2 04/27/24 1002 90 %     Weight --      Height --      Head Circumference --      Peak Flow --      Pain Score 04/27/24 1003 4     Pain Loc --      Pain Education --      Exclude from Growth Chart --    No data found.  Updated Vital Signs BP (!) 162/72 (BP Location: Right Arm)   Temp 97.6 F (36.4 C) (Oral)   Resp 16   SpO2  90%   Visual Acuity Right Eye Distance:   Left Eye Distance:   Bilateral Distance:  Right Eye Near:   Left Eye Near:    Bilateral Near:     Physical Exam Vitals and nursing note reviewed.  Constitutional:      Appearance: Normal appearance. She is not ill-appearing.  HENT:     Head: Atraumatic.  Eyes:     Extraocular Movements: Extraocular movements intact.     Conjunctiva/sclera: Conjunctivae normal.  Cardiovascular:     Rate and Rhythm: Normal rate.  Pulmonary:     Effort: Pulmonary effort is normal.  Musculoskeletal:        General: Swelling and tenderness present. No deformity or signs of injury. Normal range of motion.     Cervical back: Normal range of motion and neck supple.     Comments: Edema diffusely to right anterior knee.  No point tenderness diffusely right knee.  Range of motion intact, mild crepitus on passive range of motion  Skin:    General: Skin is warm and dry.     Findings: No bruising or erythema.  Neurological:     Mental Status: She is alert and oriented to person, place, and time.     Comments: Right lower extremity neurovascularly intact  Psychiatric:        Mood and Affect: Mood normal.        Thought Content: Thought content normal.        Judgment: Judgment normal.      UC Treatments / Results  Labs (all labs ordered are listed, but only abnormal results are displayed) Labs Reviewed - No data to display  EKG   Radiology No results found.  Procedures Procedures (including critical care time)  Medications Ordered in UC Medications - No data to display  Initial Impression / Assessment and Plan / UC Course  I have reviewed the triage vital signs and the nursing notes.  Pertinent labs & imaging results that were available during my care of the patient were reviewed by me and considered in my medical decision making (see chart for details).     Acute on chronic arthritis of the right knee.  Repeat x-ray imaging deferred with  shared decision making as recent x-rays as recently as 2 years ago showed tricompartmental arthritis and no new injury, loss of range of motion.  Treat with Voltaren gel, Tylenol, RICE protocol.  Ortho follow-up if not resolving.  Final Clinical Impressions(s) / UC Diagnoses   Final diagnoses:  Primary osteoarthritis of right knee   Discharge Instructions   None    ED Prescriptions     Medication Sig Dispense Auth. Provider   diclofenac Sodium (VOLTAREN ARTHRITIS PAIN) 1 % GEL Apply 2 g topically 4 (four) times daily. 150 g Corbin Dess, [place]      PDMP not reviewed this encounter.   Corbin Dess, [place] 04/27/24 1057

## 2024-04-27 NOTE — ED Triage Notes (Addendum)
 Pt reports right knee pain x 1 week, popping in and out on the lateral side of the knee and can here knee popping when walking.

## 2024-05-01 ENCOUNTER — Encounter: Payer: Self-pay | Admitting: Gastroenterology

## 2024-05-20 ENCOUNTER — Other Ambulatory Visit: Payer: Self-pay | Admitting: Internal Medicine

## 2024-06-07 ENCOUNTER — Other Ambulatory Visit: Payer: Self-pay | Admitting: Internal Medicine

## 2024-06-07 DIAGNOSIS — I10 Essential (primary) hypertension: Secondary | ICD-10-CM

## 2024-06-14 ENCOUNTER — Other Ambulatory Visit: Payer: Self-pay | Admitting: Internal Medicine

## 2024-06-14 DIAGNOSIS — I10 Essential (primary) hypertension: Secondary | ICD-10-CM

## 2024-06-25 NOTE — Progress Notes (Deleted)
 GI Office Note    Referring Provider: No ref. provider found Primary Care Physician:  No primary care provider on file. Primary Gastroenterologist: Lamar HERO.Rourk, MD  Date:  06/25/2024  ID:  Sydney Jones, DOB October 24, 1946, MRN [id number]  Chief Complaint   No chief complaint on file.  History of Present Illness  Sydney Jones is a 78 y.o. female with a history of anxiety/depression, constipation, GERD, hypothyroidism, IDA, and cirrhosis presenting today with complaint of ***  Workup 2022: Ultrasound with nodular liver contour with concern for cirrhosis which was also noted on CT scan. K PA 4.2. Bilateral ovarian lesions noted advised to follow-up with gynecology for vaginal bleeding and need for endometrial biopsy. Given financial difficulties after loss of her husband she previously declined colonoscopy due to financial reasons.    Office visit 05/03/2022. GERD controlled with PPI twice daily, denied breakthrough symptoms.  Taking 1 Colace daily and 2 on days she takes iron.  Has a bowel movement that is soft every other day without abdominal pain.  Taking iron every other day.  Denies fatigue, pruritus, mental status changes.  Reported vaginal bleeding had decreased.  Patient reports embarrassment over having no water  at home for shower.   Prior workup with positive ANA and mildly elevated ASMA at 31, AMA negative, negative for viral hepatitis, and normal immunoglobulins. Previously normal ferritin and iron panel.  AFP normal in March 2024.   OV 06/08/23.  Denied jaundice, edema, constipation, mental status changes, abdominal pain.  GERD fairly well-controlled without any other upper GI symptoms.  No alarm symptoms in regards to IDA, taking iron daily or every other day.  Noted some occasional lower extremity swelling, will use her TED hose in the wintertime as needed.  Advised ultrasound with elastography, MELD labs in 6 months.  Continue PPI 1-2 times daily and iron every other  day.  Last office visit 02/06/2024.***. Advised to continue iron every other day.  Advise labs in 4 months and ultrasound with elastography (plans to perform annually).  Continue pantoprazole  40 mg twice daily.  Given 90-day supply.  Advise increase fiber in her diet and a 2 g sodium diet.    Today:  Cirrhosis history Hematemesis/coffee ground emesis: *** History of variceal bleeding: *** Abdominal pain: *** Abdominal distention/worsening ascites: *** Fever/chills: *** Episodes of confusion/disorientation: *** Number of daily bowel movements: *** Taking diuretics?: *** Date of last EGD: *** Prior history of banding?: *** Prior episodes of SBP: *** Last time liver imaging was performed: July 2024 -median K PA 2.8.  Nodularity noted, no mention of hepatoma.  Hepatitis A and B vaccination status: ***   MELD 3.0: 17 at 12/01/2023  3:47 PM MELD-Na: 6 at 12/01/2023  3:47 PM Calculated from: Serum Creatinine: 0.49 mg/dL (Using min of 1 mg/dL) at 07/05/7973  7:56 PM Serum Sodium: 125 mmol/L at 12/01/2023  2:43 PM Total Bilirubin: 0.6 mg/dL (Using min of 1 mg/dL) at 07/05/7973  6:52 PM Serum Albumin: 4 g/dL (Using max of 3.5 g/dL) at 07/05/7973  6:52 PM INR(ratio): 1 at 12/01/2023  3:47 PM Age at listing (hypothetical): 77 years Sex: Female at 12/01/2023  3:47 PM     Wt Readings from Last 5 Encounters:  04/05/24 120 lb 8 oz (54.7 kg)  02/08/24 121 lb (54.9 kg)  02/06/24 121 lb 3.2 oz (55 kg)  12/13/23 122 lb 3.2 oz (55.4 kg)  10/05/23 123 lb (55.8 kg)    Current Outpatient Medications  Medication Sig Dispense Refill  acetaminophen (TYLENOL) 500 MG tablet Take 1,000 mg by mouth at bedtime.     amLODipine  (NORVASC ) 5 MG tablet Take 1 tablet (5 mg total) by mouth 2 (two) times daily. 180 tablet 3   Cholecalciferol (D3 5000) 125 MCG (5000 UT) capsule Take 5,000 Units by mouth daily.     diclofenac  Sodium (VOLTAREN  ARTHRITIS PAIN) 1 % GEL Apply 2 g topically 4 (four) times daily. 150 g 1    docusate sodium (COLACE) 100 MG capsule Take 100 mg by mouth daily.     doxazosin  (CARDURA ) 2 MG tablet Take 1 tablet (2 mg total) by mouth at bedtime. 90 tablet 3   estradiol  (ESTRACE  VAGINAL) 0.1 MG/GM vaginal cream Place 1 Applicatorful vaginally at bedtime. 42.5 g 12   Ferrous Sulfate (IRON) 325 (65 Fe) MG TABS Take 1 tablet by mouth every other day.     levothyroxine  (SYNTHROID ) 25 MCG tablet Take 1 tablet (25 mcg total) by mouth every morning. Take 1 a day mon-Saturday and 2 on Sundays 90 tablet 3   lisinopril  (ZESTRIL ) 40 MG tablet TAKE 1 TABLET BY MOUTH ONCE DAILY IN THE MORNING 90 tablet 0   loratadine (CLARITIN) 10 MG tablet Take 10 mg by mouth every morning.     pantoprazole  (PROTONIX ) 40 MG tablet Take 1 tablet (40 mg total) by mouth 2 (two) times daily before a meal. 180 tablet 3   rosuvastatin  (CRESTOR ) 10 MG tablet Take 1 tablet by mouth once daily 90 tablet 0   No current facility-administered medications for this visit.    Past Medical History:  Diagnosis Date   Acid reflux    Anxiety    COPD (chronic obstructive pulmonary disease) (HCC)    Hypertension    Thyroid  disease    Hypothyroid    Past Surgical History:  Procedure Laterality Date   BIOPSY  05/02/2020   Procedure: BIOPSY;  Surgeon: Shaaron Lamar HERO, MD;  Location: [facility];  Service: Endoscopy;;  gastric   CATARACT EXTRACTION     ESOPHAGOGASTRODUODENOSCOPY N/A 05/02/2020   Procedure: ESOPHAGOGASTRODUODENOSCOPY (EGD);  Surgeon: Shaaron Lamar HERO, MD;  Mild erosive reflux esophagitis, benign-appearing gastric polyps but otherwise normal-appearing stomach s/p polypectomy and gastric mucosal biopsy, patent pylorus, normal examined duodenum.  Pathology with fundic gland polyp, mild chronic gastritis, negative for H. pylori.     LAPAROSCOPY  1980s   for endometriosis    perdontal     POLYPECTOMY  05/02/2020   Procedure: POLYPECTOMY;  Surgeon: Shaaron Lamar HERO, MD;  Location: [facility];  Service: Endoscopy;;   gastric   WISDOM TOOTH EXTRACTION      Family History  Problem Relation Age of Onset   Emphysema Mother    Diabetes Father    Hypertension Father    Stroke Father    Colon cancer Neg Hx    Stomach cancer Neg Hx    Esophageal cancer Neg Hx    Pancreatic cancer Neg Hx    Breast cancer Neg Hx    Liver disease Neg Hx     Allergies as of 06/26/2024 - Review Complete 04/27/2024  Allergen Reaction Noted   Codeine Nausea Only and Nausea And Vomiting 05/25/2018   Prozac  [fluoxetine ] Other (See Comments) 04/27/2024   Tetracyclines & related Other (See Comments) 06/21/2018   Valium [diazepam] Other (See Comments) 12/01/2023    Social History   Socioeconomic History   Marital status: Widowed    Spouse name: Not on file   Number of children: Not on  file   Years of education: Not on file   Highest education level: Not on file  Occupational History   Not on file  Tobacco Use   Smoking status: Every Day    Current packs/day: 1.00    Average packs/day: 1 pack/day for 59.0 years (59.0 ttl pk-yrs)    Types: Cigarettes    Start date: 06/28/1965   Smokeless tobacco: Never  Vaping Use   Vaping status: Never Used  Substance and Sexual Activity   Alcohol use: Not Currently   Drug use: Never   Sexual activity: Not Currently    Birth control/protection: None, Post-menopausal  Other Topics Concern   Not on file  Social History Narrative   Not on file   Social Drivers of Health   Financial Resource Strain: Low Risk  (02/08/2024)   Overall Financial Resource Strain (CARDIA)    Difficulty of Paying Living Expenses: Not hard at all  Food Insecurity: No Food Insecurity (02/08/2024)   Hunger Vital Sign    Worried About Running Out of Food in the Last Year: Never true    Ran Out of Food in the Last Year: Never true  Transportation Needs: No Transportation Needs (02/08/2024)   PRAPARE - Administrator, Civil Service (Medical): No    Lack of Transportation (Non-Medical): No   Physical Activity: Patient Declined (02/08/2024)   Exercise Vital Sign    Days of Exercise per Week: Patient declined    Minutes of Exercise per Session: Patient declined  Stress: No Stress Concern Present (02/08/2024)   [employer] of Occupational Health - Occupational Stress Questionnaire    Feeling of Stress : Not at all  Social Connections: Socially Isolated (02/08/2024)   Social Connection and Isolation Panel    Frequency of Communication with Friends and Family: More than three times a week    Frequency of Social Gatherings with Friends and Family: More than three times a week    Attends Religious Services: Never    Database administrator or Organizations: No    Attends Banker Meetings: Never    Marital Status: Widowed     Review of Systems   Gen: Denies fever, chills, anorexia. Denies fatigue, weakness, weight loss.  CV: Denies chest pain, palpitations, syncope, peripheral edema, and claudication. Resp: Denies dyspnea at rest, cough, wheezing, coughing up blood, and pleurisy. GI: See HPI Derm: Denies rash, itching, dry skin Psych: Denies depression, anxiety, memory loss, confusion. No homicidal or suicidal ideation.  Heme: Denies bruising, bleeding, and enlarged lymph nodes.  Physical Exam   There were no vitals taken for this visit.  General:   Alert and oriented. No distress noted. Pleasant and cooperative.  Head:  Normocephalic and atraumatic. Eyes:  Conjuctiva clear without scleral icterus. Mouth:  Oral mucosa pink and moist. Good dentition. No lesions. Lungs:  Clear to auscultation bilaterally. No wheezes, rales, or rhonchi. No distress.  Heart:  S1, S2 present without murmurs appreciated.  Abdomen:  +BS, soft, non-tender and non-distended. No rebound or guarding. No HSM or masses noted. Rectal: *** Msk:  Symmetrical without gross deformities. Normal posture. Extremities:  Without edema. Neurologic:  Alert and  oriented x4 Psych:  Alert and  cooperative. Normal mood and affect.  Assessment  Sydney Jones is a 78 y.o. female presenting today with ***  Cirrhosis:  IDA:  GERD:  Constipation:  PLAN   *** RUQ US  with elastography CMP, INR, AFP 2 g sodium diet Continue pantoprazole  40 mg  BID*** Continue oral iron every other day Follow up ***6 months    Charmaine Melia, MSN, FNP-BC, AGACNP-BC [facility]

## 2024-06-26 ENCOUNTER — Ambulatory Visit: Admitting: Gastroenterology

## 2024-07-08 ENCOUNTER — Other Ambulatory Visit: Payer: Self-pay | Admitting: Internal Medicine

## 2024-08-09 ENCOUNTER — Ambulatory Visit: Admitting: Gastroenterology

## 2024-08-15 ENCOUNTER — Other Ambulatory Visit: Payer: Self-pay

## 2024-09-04 ENCOUNTER — Other Ambulatory Visit: Payer: Self-pay

## 2024-09-04 DIAGNOSIS — I10 Essential (primary) hypertension: Secondary | ICD-10-CM

## 2024-09-08 ENCOUNTER — Other Ambulatory Visit: Payer: Self-pay

## 2024-09-08 DIAGNOSIS — I10 Essential (primary) hypertension: Secondary | ICD-10-CM

## 2024-10-05 ENCOUNTER — Other Ambulatory Visit: Payer: Self-pay

## 2024-10-08 ENCOUNTER — Other Ambulatory Visit: Payer: Self-pay | Admitting: Internal Medicine

## 2024-10-09 ENCOUNTER — Ambulatory Visit (INDEPENDENT_AMBULATORY_CARE_PROVIDER_SITE_OTHER)

## 2024-10-09 VITALS — BP 129/71 | HR 85 | Ht 63.0 in | Wt 114.1 lb

## 2024-10-09 DIAGNOSIS — E039 Hypothyroidism, unspecified: Secondary | ICD-10-CM

## 2024-10-09 DIAGNOSIS — Z23 Encounter for immunization: Secondary | ICD-10-CM

## 2024-10-09 DIAGNOSIS — K2100 Gastro-esophageal reflux disease with esophagitis, without bleeding: Secondary | ICD-10-CM

## 2024-10-09 DIAGNOSIS — E785 Hyperlipidemia, unspecified: Secondary | ICD-10-CM

## 2024-10-09 DIAGNOSIS — I10 Essential (primary) hypertension: Secondary | ICD-10-CM

## 2024-10-09 DIAGNOSIS — R7303 Prediabetes: Secondary | ICD-10-CM

## 2024-10-09 DIAGNOSIS — N952 Postmenopausal atrophic vaginitis: Secondary | ICD-10-CM

## 2024-10-09 DIAGNOSIS — K7460 Unspecified cirrhosis of liver: Secondary | ICD-10-CM

## 2024-10-09 MED ORDER — ROSUVASTATIN CALCIUM 10 MG PO TABS: 10.0000 mg | ORAL_TABLET | Freq: Every day | ORAL | 3 refills | Status: AC

## 2024-10-09 MED ORDER — LEVOTHYROXINE SODIUM 25 MCG PO TABS: 25.0000 ug | ORAL_TABLET | Freq: Every morning | ORAL | 3 refills | Status: DC

## 2024-10-09 MED ORDER — ESTRADIOL 0.01 % VA CREA: 1.0000 | TOPICAL_CREAM | VAGINAL | 12 refills | Status: AC

## 2024-10-09 MED ORDER — AMLODIPINE BESYLATE 5 MG PO TABS: 5.0000 mg | ORAL_TABLET | Freq: Two times a day (BID) | ORAL | 3 refills | Status: AC

## 2024-10-09 MED ORDER — PANTOPRAZOLE SODIUM 40 MG PO TBEC: 40.0000 mg | DELAYED_RELEASE_TABLET | Freq: Two times a day (BID) | ORAL | 3 refills | Status: AC

## 2024-10-09 MED ORDER — LISINOPRIL 40 MG PO TABS: 40.0000 mg | ORAL_TABLET | Freq: Every morning | ORAL | 3 refills | Status: AC

## 2024-10-09 MED ORDER — DOXAZOSIN MESYLATE 2 MG PO TABS: 2.0000 mg | ORAL_TABLET | Freq: Every day | ORAL | 3 refills | Status: AC

## 2024-10-09 NOTE — Progress Notes (Unsigned)
   Established Patient Office Visit  Subjective   Patient ID: Sydney Jones, female    DOB: 17-Oct-1946  Age: 78 y.o. MRN: [id number]  Chief Complaint  Patient presents with   Medical Management of Chronic Issues    6 month follow up    HPI  Patient Active Problem List   Diagnosis Date Noted   Heart palpitations 12/13/2023   Hyponatremia 12/13/2023   Generalized anxiety disorder 12/13/2023   Prediabetes 10/05/2023   History of osteopenia 10/05/2023   Need for influenza vaccination 10/05/2023   Essential hypertension 04/04/2023   Hyperlipidemia 04/04/2023   Hypothyroidism 04/04/2023   COPD (chronic obstructive pulmonary disease) (HCC) 04/04/2023   Current tobacco use 04/04/2023   Encounter for general adult medical examination with abnormal findings 04/04/2023   Cirrhosis of liver (HCC) 10/07/2021   Bilateral ovarian cysts 06/29/2021   PMB (postmenopausal bleeding) 06/29/2021   Vaginal atrophy 06/29/2021   Abnormal vaginal bleeding 06/08/2021   Gastroesophageal reflux disease 06/16/2020   Iron deficiency anemia 06/16/2020   Low iron stores 06/16/2020   Abdominal pain, epigastric 04/04/2020   Positive occult stool blood test 04/04/2020   Constipation 04/04/2020      ROS    Objective:     BP 129/71   Pulse 85   Ht 5' 3 (1.6 m)   Wt 114 lb 1.9 oz (51.8 kg)   SpO2 93%   BMI 20.22 kg/m  BP Readings from Last 3 Encounters:  10/09/24 129/71  04/27/24 (!) 162/72  04/05/24 (!) 148/70   Wt Readings from Last 3 Encounters:  10/09/24 114 lb 1.9 oz (51.8 kg)  04/05/24 120 lb 8 oz (54.7 kg)  02/08/24 121 lb (54.9 kg)     Physical Exam Vitals and nursing note reviewed.  Constitutional:      Appearance: Normal appearance.  HENT:     Head: Normocephalic.  Eyes:     Extraocular Movements: Extraocular movements intact.     Pupils: Pupils are equal, round, and reactive to light.  Cardiovascular:     Rate and Rhythm: Normal rate and regular rhythm.  Pulmonary:      Effort: Pulmonary effort is normal.     Breath sounds: Normal breath sounds.  Musculoskeletal:     Cervical back: Normal range of motion and neck supple.  Neurological:     Mental Status: She is alert and oriented to person, place, and time.  Psychiatric:        Mood and Affect: Mood normal.        Thought Content: Thought content normal.      No results found for any visits on 10/09/24.  {Labs (Optional):23779}  The 10-year ASCVD risk score (Arnett DK, et al., 2019) is: 37.8%    Assessment & Plan:   Problem List Items Addressed This Visit   None Visit Diagnoses       Encounter for immunization    -  Primary   Relevant Orders   Flu vaccine HIGH DOSE PF(Fluzone Trivalent) (Completed)       No follow-ups on file.    Leita Longs, FNP

## 2024-10-10 LAB — CMP14+EGFR
ALT: 11 IU/L (ref 0–32)
AST: 12 IU/L (ref 0–40)
Albumin: 4.3 g/dL (ref 3.8–4.8)
Alkaline Phosphatase: 67 IU/L (ref 49–135)
BUN/Creatinine Ratio: 21 (ref 12–28)
BUN: 12 mg/dL (ref 8–27)
Bilirubin Total: 0.4 mg/dL (ref 0.0–1.2)
CO2: 24 mmol/L (ref 20–29)
Calcium: 9.9 mg/dL (ref 8.7–10.3)
Chloride: 86 mmol/L — ABNORMAL LOW (ref 96–106)
Creatinine, Ser: 0.58 mg/dL (ref 0.57–1.00)
Globulin, Total: 3.1 g/dL (ref 1.5–4.5)
Glucose: 91 mg/dL (ref 70–99)
Potassium: 4.6 mmol/L (ref 3.5–5.2)
Sodium: 124 mmol/L — ABNORMAL LOW (ref 134–144)
Total Protein: 7.4 g/dL (ref 6.0–8.5)
eGFR: 93 mL/min/1.73 (ref >59)

## 2024-10-10 LAB — HEMOGLOBIN A1C
Est. average glucose Bld gHb Est-mCnc: 117 mg/dL
Hgb A1c MFr Bld: 5.7 % — ABNORMAL HIGH (ref 4.8–5.6)

## 2024-10-10 LAB — TSH+FREE T4
Free T4: 1.38 ng/dL (ref 0.82–1.77)
TSH: 4.5 u[IU]/mL (ref 0.450–4.500)

## 2024-10-11 ENCOUNTER — Ambulatory Visit: Payer: Self-pay

## 2024-10-11 NOTE — Assessment & Plan Note (Signed)
 She is currently prescribed levothyroxine  25 mcg daily.  Repeat thyroid  studies ordered today.

## 2024-10-11 NOTE — Assessment & Plan Note (Signed)
 Her acute concern today is vaginal irritation and itching present since July.  She has been applying triple antibiotic ointment without sustained relief. -Empirically treat for candidal vaginitis with Diflucan  150 mg x 1 dose -Will also start estradiol  vaginal cream for likely vaginal atrophy

## 2024-10-11 NOTE — Assessment & Plan Note (Signed)
 Remains adequately controlled on current antihypertensive regimen.  No medication changes are indicated today.

## 2024-10-11 NOTE — Assessment & Plan Note (Signed)
Repeat A1c ordered today 

## 2024-10-11 NOTE — Assessment & Plan Note (Signed)
She is currently prescribed rosuvastatin 10 mg daily. -Repeat lipid panel ordered today

## 2024-10-11 NOTE — Assessment & Plan Note (Signed)
Symptoms are currently well-controlled on Protonix 40 mg twice daily.  She is followed by GI (Dr. Kendell Bane). -No medication changes today

## 2024-10-11 NOTE — Assessment & Plan Note (Signed)
 Followed by GI.  Repeat labs ordered today.

## 2024-10-12 ENCOUNTER — Other Ambulatory Visit: Payer: Self-pay

## 2024-10-12 DIAGNOSIS — E039 Hypothyroidism, unspecified: Secondary | ICD-10-CM

## 2024-10-12 MED ORDER — LEVOTHYROXINE SODIUM 25 MCG PO TABS: 25.0000 ug | ORAL_TABLET | Freq: Every morning | ORAL | 3 refills | Status: AC

## 2024-10-17 ENCOUNTER — Ambulatory Visit: Admitting: Gastroenterology

## 2024-10-21 ENCOUNTER — Other Ambulatory Visit: Payer: Self-pay

## 2025-02-12 ENCOUNTER — Ambulatory Visit

## 2025-06-12 ENCOUNTER — Ambulatory Visit
